# Patient Record
Sex: Female | Born: 1990 | Race: White | Hispanic: No | Marital: Single | Smoking: Former smoker
Health system: Southern US, Community
[De-identification: ages and names within clinical notes are randomized; demographics above are authoritative.]

---

## 2013-08-22 NOTE — L&D Delivery Note (Signed)
Delivery Note At 4:16 PM a viable female was delivered via Vaginal, Spontaneous Delivery (Presentation: Left Occiput Anterior).  APGAR: 9, 9; weight pending.   Placenta status: Intact, Spontaneous.  Cord:  with the following complications: None.  Anesthesia: Epidural  Episiotomy: None Lacerations: 1st degree Suture Repair: none Est. Blood Loss (mL): 400  Mom to postpartum.  Baby to Couplet care / Skin to Skin.  Kree Armato D 05/23/2014, 4:34 PM

## 2013-11-29 LAB — OB RESULTS CONSOLE GC/CHLAMYDIA
CHLAMYDIA, DNA PROBE: POSITIVE
GC PROBE AMP, GENITAL: NEGATIVE

## 2013-11-29 LAB — OB RESULTS CONSOLE ABO/RH: RH TYPE: POSITIVE

## 2013-11-29 LAB — OB RESULTS CONSOLE HEPATITIS B SURFACE ANTIGEN: HEP B S AG: NEGATIVE

## 2013-11-29 LAB — OB RESULTS CONSOLE RUBELLA ANTIBODY, IGM: Rubella: IMMUNE

## 2013-11-29 LAB — OB RESULTS CONSOLE HIV ANTIBODY (ROUTINE TESTING): HIV: NONREACTIVE

## 2013-11-29 LAB — OB RESULTS CONSOLE ANTIBODY SCREEN: Antibody Screen: NEGATIVE

## 2013-11-29 LAB — OB RESULTS CONSOLE RPR: RPR: NONREACTIVE

## 2014-05-01 LAB — OB RESULTS CONSOLE GC/CHLAMYDIA
Chlamydia: NEGATIVE
Gonorrhea: NEGATIVE

## 2014-05-01 LAB — OB RESULTS CONSOLE GBS: STREP GROUP B AG: NEGATIVE

## 2014-05-19 ENCOUNTER — Encounter (HOSPITAL_COMMUNITY): Payer: Self-pay | Admitting: *Deleted

## 2014-05-19 ENCOUNTER — Telehealth (HOSPITAL_COMMUNITY): Payer: Self-pay | Admitting: *Deleted

## 2014-05-19 NOTE — Telephone Encounter (Signed)
Preadmission screen  

## 2014-05-23 ENCOUNTER — Inpatient Hospital Stay (HOSPITAL_COMMUNITY): Payer: Medicaid Other | Admitting: Anesthesiology

## 2014-05-23 ENCOUNTER — Encounter (HOSPITAL_COMMUNITY): Payer: Self-pay

## 2014-05-23 ENCOUNTER — Encounter (HOSPITAL_COMMUNITY): Payer: Medicaid Other | Admitting: Anesthesiology

## 2014-05-23 ENCOUNTER — Inpatient Hospital Stay (HOSPITAL_COMMUNITY)
Admission: RE | Admit: 2014-05-23 | Discharge: 2014-05-25 | DRG: 775 | Disposition: A | Discharge status: Home / Self Care | Payer: Medicaid Other | Source: Ambulatory Visit | Attending: Obstetrics and Gynecology | Admitting: Obstetrics and Gynecology

## 2014-05-23 DIAGNOSIS — Z3490 Encounter for supervision of normal pregnancy, unspecified, unspecified trimester: Secondary | ICD-10-CM

## 2014-05-23 DIAGNOSIS — Z3A39 39 weeks gestation of pregnancy: Secondary | ICD-10-CM | POA: Diagnosis present

## 2014-05-23 DIAGNOSIS — O99334 Smoking (tobacco) complicating childbirth: Secondary | ICD-10-CM | POA: Diagnosis present

## 2014-05-23 DIAGNOSIS — Z3483 Encounter for supervision of other normal pregnancy, third trimester: Secondary | ICD-10-CM | POA: Diagnosis present

## 2014-05-23 LAB — CBC
HEMATOCRIT: 33.1 % — AB (ref 36.0–46.0)
HEMOGLOBIN: 12 g/dL (ref 12.0–15.0)
MCH: 31.5 pg (ref 26.0–34.0)
MCHC: 36.3 g/dL — ABNORMAL HIGH (ref 30.0–36.0)
MCV: 86.9 fL (ref 78.0–100.0)
Platelets: 221 10*3/uL (ref 150–400)
RBC: 3.81 MIL/uL — ABNORMAL LOW (ref 3.87–5.11)
RDW: 12.6 % (ref 11.5–15.5)
WBC: 7.8 10*3/uL (ref 4.0–10.5)

## 2014-05-23 LAB — TYPE AND SCREEN
ABO/RH(D): O POS
ANTIBODY SCREEN: NEGATIVE

## 2014-05-23 LAB — ABO/RH: ABO/RH(D): O POS

## 2014-05-23 LAB — RPR

## 2014-05-23 MED ORDER — BENZOCAINE-MENTHOL 20-0.5 % EX AERO
1.0000 "application " | INHALATION_SPRAY | CUTANEOUS | Status: DC | PRN
  Filled 2014-05-23: qty 56

## 2014-05-23 MED ORDER — PHENYLEPHRINE 40 MCG/ML (10ML) SYRINGE FOR IV PUSH (FOR BLOOD PRESSURE SUPPORT)
PREFILLED_SYRINGE | INTRAVENOUS | Status: AC
  Filled 2014-05-23: qty 10

## 2014-05-23 MED ORDER — OXYCODONE-ACETAMINOPHEN 5-325 MG PO TABS: 1.0000 | ORAL_TABLET | ORAL | Status: DC | PRN

## 2014-05-23 MED ORDER — ONDANSETRON HCL 4 MG/2ML IJ SOLN: 4.0000 mg | INTRAMUSCULAR | Status: DC | PRN

## 2014-05-23 MED ORDER — METHYLERGONOVINE MALEATE 0.2 MG PO TABS: 0.2000 mg | ORAL_TABLET | ORAL | Status: DC | PRN

## 2014-05-23 MED ORDER — OXYCODONE-ACETAMINOPHEN 5-325 MG PO TABS
1.0000 | ORAL_TABLET | ORAL | Status: DC | PRN
  Administered 2014-05-24 – 2014-05-25 (×2): 1 via ORAL
  Filled 2014-05-23 (×2): qty 1

## 2014-05-23 MED ORDER — LANOLIN HYDROUS EX OINT: TOPICAL_OINTMENT | CUTANEOUS | Status: DC | PRN

## 2014-05-23 MED ORDER — PHENYLEPHRINE 40 MCG/ML (10ML) SYRINGE FOR IV PUSH (FOR BLOOD PRESSURE SUPPORT)
80.0000 ug | PREFILLED_SYRINGE | INTRAVENOUS | Status: DC | PRN
  Filled 2014-05-23: qty 2

## 2014-05-23 MED ORDER — MAGNESIUM HYDROXIDE 400 MG/5ML PO SUSP: 30.0000 mL | ORAL | Status: DC | PRN

## 2014-05-23 MED ORDER — LACTATED RINGERS IV SOLN: 500.0000 mL | INTRAVENOUS | Status: DC | PRN

## 2014-05-23 MED ORDER — SIMETHICONE 80 MG PO CHEW
80.0000 mg | CHEWABLE_TABLET | ORAL | Status: DC | PRN
Start: 2014-05-23 — End: 2014-05-25

## 2014-05-23 MED ORDER — OXYCODONE-ACETAMINOPHEN 5-325 MG PO TABS: 2.0000 | ORAL_TABLET | ORAL | Status: DC | PRN

## 2014-05-23 MED ORDER — OXYTOCIN 40 UNITS IN LACTATED RINGERS INFUSION - SIMPLE MED
1.0000 m[IU]/min | INTRAVENOUS | Status: DC
  Administered 2014-05-23: 2 m[IU]/min via INTRAVENOUS
  Filled 2014-05-23 (×2): qty 1000

## 2014-05-23 MED ORDER — SENNOSIDES-DOCUSATE SODIUM 8.6-50 MG PO TABS
2.0000 | ORAL_TABLET | ORAL | Status: DC
  Administered 2014-05-23: 2 via ORAL
  Filled 2014-05-23 (×2): qty 2

## 2014-05-23 MED ORDER — ZOLPIDEM TARTRATE 5 MG PO TABS: 5.0000 mg | ORAL_TABLET | Freq: Every evening | ORAL | Status: DC | PRN

## 2014-05-23 MED ORDER — TETANUS-DIPHTH-ACELL PERTUSSIS 5-2.5-18.5 LF-MCG/0.5 IM SUSP
0.5000 mL | Freq: Once | INTRAMUSCULAR | Status: DC
Start: 2014-05-24 — End: 2014-05-25

## 2014-05-23 MED ORDER — DIBUCAINE 1 % RE OINT
1.0000 "application " | TOPICAL_OINTMENT | RECTAL | Status: DC | PRN
  Administered 2014-05-25: 1 via RECTAL
  Filled 2014-05-23: qty 28

## 2014-05-23 MED ORDER — FLEET ENEMA 7-19 GM/118ML RE ENEM: 1.0000 | ENEMA | RECTAL | Status: DC | PRN

## 2014-05-23 MED ORDER — METHYLERGONOVINE MALEATE 0.2 MG/ML IJ SOLN: 0.2000 mg | INTRAMUSCULAR | Status: DC | PRN

## 2014-05-23 MED ORDER — FENTANYL 2.5 MCG/ML BUPIVACAINE 1/10 % EPIDURAL INFUSION (WH - ANES)
INTRAMUSCULAR | Status: AC
  Filled 2014-05-23: qty 125

## 2014-05-23 MED ORDER — DIPHENHYDRAMINE HCL 25 MG PO CAPS: 25.0000 mg | ORAL_CAPSULE | Freq: Four times a day (QID) | ORAL | Status: DC | PRN

## 2014-05-23 MED ORDER — CITRIC ACID-SODIUM CITRATE 334-500 MG/5ML PO SOLN: 30.0000 mL | ORAL | Status: DC | PRN

## 2014-05-23 MED ORDER — MEASLES, MUMPS & RUBELLA VAC ~~LOC~~ INJ
0.5000 mL | INJECTION | Freq: Once | SUBCUTANEOUS | Status: DC
  Filled 2014-05-23: qty 0.5

## 2014-05-23 MED ORDER — OXYCODONE-ACETAMINOPHEN 5-325 MG PO TABS
2.0000 | ORAL_TABLET | ORAL | Status: DC | PRN
  Administered 2014-05-25: 2 via ORAL
  Filled 2014-05-23: qty 2

## 2014-05-23 MED ORDER — ONDANSETRON HCL 4 MG PO TABS: 4.0000 mg | ORAL_TABLET | ORAL | Status: DC | PRN

## 2014-05-23 MED ORDER — LIDOCAINE HCL (PF) 1 % IJ SOLN
INTRAMUSCULAR | Status: DC | PRN
  Administered 2014-05-23 (×5): 4 mL

## 2014-05-23 MED ORDER — EPHEDRINE 5 MG/ML INJ
10.0000 mg | INTRAVENOUS | Status: DC | PRN
  Filled 2014-05-23: qty 2

## 2014-05-23 MED ORDER — DIPHENHYDRAMINE HCL 50 MG/ML IJ SOLN: 12.5000 mg | INTRAMUSCULAR | Status: DC | PRN

## 2014-05-23 MED ORDER — PRENATAL MULTIVITAMIN CH
1.0000 | ORAL_TABLET | Freq: Every day | ORAL | Status: DC
  Administered 2014-05-24: 1 via ORAL
  Filled 2014-05-23: qty 1

## 2014-05-23 MED ORDER — OXYTOCIN 40 UNITS IN LACTATED RINGERS INFUSION - SIMPLE MED
62.5000 mL/h | INTRAVENOUS | Status: DC
  Administered 2014-05-23: 500 mL/h via INTRAVENOUS

## 2014-05-23 MED ORDER — ONDANSETRON HCL 4 MG/2ML IJ SOLN: 4.0000 mg | Freq: Four times a day (QID) | INTRAMUSCULAR | Status: DC | PRN

## 2014-05-23 MED ORDER — ACETAMINOPHEN 325 MG PO TABS: 650.0000 mg | ORAL_TABLET | ORAL | Status: DC | PRN

## 2014-05-23 MED ORDER — TERBUTALINE SULFATE 1 MG/ML IJ SOLN: 0.2500 mg | Freq: Once | INTRAMUSCULAR | Status: DC | PRN

## 2014-05-23 MED ORDER — LIDOCAINE HCL (PF) 1 % IJ SOLN
30.0000 mL | INTRAMUSCULAR | Status: DC | PRN
  Filled 2014-05-23: qty 30

## 2014-05-23 MED ORDER — IBUPROFEN 600 MG PO TABS
600.0000 mg | ORAL_TABLET | Freq: Four times a day (QID) | ORAL | Status: DC
  Administered 2014-05-23 – 2014-05-25 (×6): 600 mg via ORAL
  Filled 2014-05-23 (×6): qty 1

## 2014-05-23 MED ORDER — WITCH HAZEL-GLYCERIN EX PADS
1.0000 | MEDICATED_PAD | CUTANEOUS | Status: DC | PRN
Start: 2014-05-23 — End: 2014-05-25
  Administered 2014-05-25: 1 via TOPICAL

## 2014-05-23 MED ORDER — LACTATED RINGERS IV SOLN
500.0000 mL | Freq: Once | INTRAVENOUS | Status: AC
  Administered 2014-05-23: 1000 mL via INTRAVENOUS

## 2014-05-23 MED ORDER — LACTATED RINGERS IV SOLN
INTRAVENOUS | Status: DC
  Administered 2014-05-23: 1000 mL via INTRAVENOUS
  Administered 2014-05-23: 10:00:00 via INTRAVENOUS
  Administered 2014-05-23: 1000 mL via INTRAVENOUS

## 2014-05-23 MED ORDER — FENTANYL 2.5 MCG/ML BUPIVACAINE 1/10 % EPIDURAL INFUSION (WH - ANES)
14.0000 mL/h | INTRAMUSCULAR | Status: DC | PRN
  Administered 2014-05-23: 14 mL/h via EPIDURAL
  Filled 2014-05-23 (×2): qty 125

## 2014-05-23 MED ORDER — OXYTOCIN BOLUS FROM INFUSION: 500.0000 mL | INTRAVENOUS | Status: DC

## 2014-05-23 NOTE — Anesthesia Procedure Notes (Signed)
Epidural Patient location during procedure: OB Start time: 05/23/2014 10:59 AM  Staffing Anesthesiologist: Yeraldy Spike Performed by: anesthesiologist   Preanesthetic Checklist Completed: patient identified, site marked, surgical consent, pre-op evaluation, timeout performed, IV checked, risks and benefits discussed and monitors and equipment checked  Epidural Patient position: sitting Prep: site prepped and draped and DuraPrep Patient monitoring: continuous pulse ox and blood pressure Approach: midline Location: L3-L4 Injection technique: LOR air  Needle:  Needle type: Tuohy  Needle gauge: 17 G Needle length: 9 cm and 9 Needle insertion depth: 6 cm Catheter type: closed end flexible Catheter size: 19 Gauge Catheter at skin depth: 11 cm Test dose: negative  Assessment Events: blood not aspirated, injection not painful, no injection resistance, negative IV test and no paresthesia  Additional Notes Discussed risk of headache, infection, bleeding, nerve injury and failed or incomplete block.  Patient voices understanding and wishes to proceed.  Epidural placed easily on first attempt.  No paresthesia.  Patient nervous and moving during procedure, but with no apparent complications.  A.Kamoni Gentles, MDReason for block:procedure for pain

## 2014-05-23 NOTE — Progress Notes (Signed)
Comfortable with epidural Afeb, VSS FHT- Cat I VE-9/C/0, vtx Pitocin curretnty off, monitor progress, anticipate SVD

## 2014-05-23 NOTE — H&P (Signed)
Crystal Cannon is a 23 y.o. female, G2 P0010, EGA [redacted] weeks with EDC 10-9 presenting for elective induction with favorable cervix.  Prenatal care essentially uncomplicated, see prenatal records for complete history.  Maternal Medical History:  Fetal activity: Perceived fetal activity is normal.      OB History   Grav Para Term Preterm Abortions TAB SAB Ect Mult Living   2 0   1  1        No past medical history on file. No past surgical history on file. Family History: family history is not on file. Social History:  reports that she has been smoking.  She has never used smokeless tobacco. She reports that she does not drink alcohol or use illicit drugs.   Prenatal Transfer Tool  Maternal Diabetes: No Genetic Screening: Normal Maternal Ultrasounds/Referrals: Normal Fetal Ultrasounds or other Referrals:  None Maternal Substance Abuse:  No Significant Maternal Medications:  None Significant Maternal Lab Results:  Lab values include: Group B Strep negative Other Comments:  None  Review of Systems  Respiratory: Negative.   Cardiovascular: Negative.    AROM clear Dilation: 5 Effacement (%): 80 Station: -1 Exam by:: dr Crystal Cannon Blood pressure 132/84, pulse 90, temperature 97.7 F (36.5 C), temperature source Oral, resp. rate 20, last menstrual period 08/23/2013. Maternal Exam:  Uterine Assessment: Contraction strength is mild.  Contraction frequency is irregular.   Abdomen: Patient reports no abdominal tenderness. Estimated fetal weight is 7 1/2 lbs.   Fetal presentation: vertex  Introitus: Normal vulva. Normal vagina.  Amniotic fluid character: clear.  Pelvis: adequate for delivery.   Cervix: Cervix evaluated by digital exam.     Fetal Exam Fetal Monitor Review: Mode: ultrasound.   Baseline rate: 140.  Variability: moderate (6-25 bpm).   Pattern: accelerations present and no decelerations.    Fetal State Assessment: Category I - tracings are  normal.     Physical Exam  Constitutional: She appears well-developed and well-nourished.  Cardiovascular: Normal rate, regular rhythm and normal heart sounds.   No murmur heard. Respiratory: Effort normal and breath sounds normal. No respiratory distress. She has no wheezes.  GI: Soft.    Prenatal labs: ABO, Rh: O/Positive/-- (04/10 0000) Antibody: Negative (04/10 0000) Rubella: Immune (04/10 0000) RPR: Nonreactive (04/10 0000)  HBsAg: Negative (04/10 0000)  HIV: Non-reactive (04/10 0000)  GBS: Negative (09/10 0000)  GCT:  138, nl GTT  Assessment/Plan: IUP at 39 weeks with favorable cervix for induction.  AROM done, will start pitocin, monitor progress, anticipate SVD.   Crystal Cannon 05/23/2014, 8:29 AM

## 2014-05-23 NOTE — Anesthesia Preprocedure Evaluation (Signed)
Anesthesia Evaluation  Patient identified by MRN, date of birth, ID band Patient awake    Reviewed: Allergy & Precautions, H&P , NPO status , Patient's Chart, lab work & pertinent test results, reviewed documented beta blocker date and time   History of Anesthesia Complications Negative for: history of anesthetic complications  Airway Mallampati: III TM Distance: >3 FB Neck ROM: full    Dental  (+) Teeth Intact   Pulmonary Current Smoker,  breath sounds clear to auscultation        Cardiovascular negative cardio ROS  Rhythm:regular Rate:Normal     Neuro/Psych negative neurological ROS  negative psych ROS   GI/Hepatic negative GI ROS, Neg liver ROS,   Endo/Other  negative endocrine ROSBMI 35  Renal/GU negative Renal ROS     Musculoskeletal   Abdominal   Peds  Hematology negative hematology ROS (+)   Anesthesia Other Findings   Reproductive/Obstetrics (+) Pregnancy                           Anesthesia Physical Anesthesia Plan  ASA: II  Anesthesia Plan: Epidural   Post-op Pain Management:    Induction:   Airway Management Planned:   Additional Equipment:   Intra-op Plan:   Post-operative Plan:   Informed Consent: I have reviewed the patients History and Physical, chart, labs and discussed the procedure including the risks, benefits and alternatives for the proposed anesthesia with the patient or authorized representative who has indicated his/her understanding and acceptance.     Plan Discussed with:   Anesthesia Plan Comments:         Anesthesia Quick Evaluation

## 2014-05-24 ENCOUNTER — Encounter (HOSPITAL_COMMUNITY): Payer: Self-pay

## 2014-05-24 MED ORDER — INFLUENZA VAC SPLIT QUAD 0.5 ML IM SUSY
0.5000 mL | PREFILLED_SYRINGE | INTRAMUSCULAR | Status: AC
  Administered 2014-05-24: 0.5 mL via INTRAMUSCULAR

## 2014-05-24 NOTE — Anesthesia Postprocedure Evaluation (Signed)
  Anesthesia Post-op Note  Patient: Crystal Cannon  Procedure(s) Performed: * No procedures listed *  Patient Location: Mother/Baby  Anesthesia Type:Epidural  Level of Consciousness: awake  Airway and Oxygen Therapy: Patient Spontanous Breathing  Post-op Pain: mild  Post-op Assessment: Patient's Cardiovascular Status Stable and Respiratory Function Stable  Post-op Vital Signs: stable  Last Vitals:  Filed Vitals:   05/23/14 2350  BP: 119/62  Pulse: 86  Temp: 37.1 C  Resp: 18    Complications: No apparent anesthesia complications

## 2014-05-24 NOTE — Progress Notes (Signed)
PPD #1 No problems Afeb, VSS Fundus firm, NT at U-1 Continue routine postpartum care 

## 2014-05-25 MED ORDER — OXYCODONE-ACETAMINOPHEN 5-325 MG PO TABS: 1.0000 | ORAL_TABLET | ORAL | Status: DC | PRN

## 2014-05-25 MED ORDER — IBUPROFEN 600 MG PO TABS: 600.0000 mg | ORAL_TABLET | Freq: Four times a day (QID) | ORAL | Status: DC

## 2014-05-25 NOTE — Progress Notes (Signed)
PPD #2 No problems Afeb, VSS Fundus firm D/c home 

## 2014-05-25 NOTE — Discharge Summary (Signed)
Obstetric Discharge Summary Reason for Admission: induction of labor Prenatal Procedures: none Intrapartum Procedures: spontaneous vaginal delivery Postpartum Procedures: none Complications-Operative and Postpartum: 1st degree perineal laceration Hemoglobin  Date Value Ref Range Status  05/23/2014 12.0  12.0 - 15.0 g/dL Final     HCT  Date Value Ref Range Status  05/23/2014 33.1* 36.0 - 46.0 % Final    Physical Exam:  General: alert Lochia: appropriate Uterine Fundus: firm   Discharge Diagnoses: Term Pregnancy-delivered  Discharge Information: Date: 05/25/2014 Activity: pelvic rest Diet: routine Medications: Ibuprofen and Percocet Condition: stable Instructions: refer to practice specific booklet Discharge to: home Follow-up Information   Follow up with Murvin Gift D, MD. Schedule an appointment as soon as possible for a visit in 6 weeks.   Specialty:  Obstetrics and Gynecology   Contact information:   [address], [address] [place] [id number] [phone]       Newborn Data: Live born female  Birth Weight: 9 lb 1 oz (4111 g) APGAR: 9, 9  Home with mother.  Zavian Slowey D 05/25/2014, 9:55 AM

## 2014-05-25 NOTE — Discharge Instructions (Signed)
As per discharge pamphlet °

## 2014-05-26 NOTE — Progress Notes (Signed)
Ur chart review completed.  

## 2014-05-30 ENCOUNTER — Inpatient Hospital Stay (HOSPITAL_COMMUNITY): Admission: AD | Admit: 2014-05-30 | Payer: Self-pay | Source: Ambulatory Visit | Admitting: Obstetrics and Gynecology

## 2014-06-23 ENCOUNTER — Encounter (HOSPITAL_COMMUNITY): Payer: Self-pay

## 2020-09-10 DIAGNOSIS — G8929 Other chronic pain: Secondary | ICD-10-CM | POA: Insufficient documentation

## 2020-09-10 DIAGNOSIS — R519 Headache, unspecified: Secondary | ICD-10-CM | POA: Insufficient documentation

## 2021-01-13 ENCOUNTER — Ambulatory Visit (INDEPENDENT_AMBULATORY_CARE_PROVIDER_SITE_OTHER): Payer: Medicaid Other

## 2021-01-13 DIAGNOSIS — Z3A10 10 weeks gestation of pregnancy: Secondary | ICD-10-CM

## 2021-01-13 DIAGNOSIS — Z3480 Encounter for supervision of other normal pregnancy, unspecified trimester: Secondary | ICD-10-CM | POA: Insufficient documentation

## 2021-01-13 DIAGNOSIS — Z3481 Encounter for supervision of other normal pregnancy, first trimester: Secondary | ICD-10-CM

## 2021-01-13 MED ORDER — BLOOD PRESSURE KIT DEVI: 1.0000 | 0 refills | Status: DC | PRN

## 2021-01-13 NOTE — Progress Notes (Signed)
Agree with A & P. 

## 2021-01-13 NOTE — Progress Notes (Signed)
..  Virtual Visit via Telephone Note  I connected with Crystal Cannon on 01/13/21 at  9:00 AM EDT by telephone and verified that I am speaking with the correct person using two identifiers.  Location:[facility] Patient: Crystal Cannon Provider: Nurse   I discussed the limitations, risks, security and privacy concerns of performing an evaluation and management service by telephone and the availability of in person appointments. I also discussed with the patient that there may be a patient responsible charge related to this service. The patient expressed understanding and agreed to proceed.   History of Present Illness: PRENATAL INTAKE SUMMARY  Crystal Cannon presents today New OB Nurse Interview.  OB History    Gravida  4   Para  1   Term  1   Preterm      AB  2   Living  1     SAB  1   IAB  1   Ectopic      Multiple      Live Births  1          I have reviewed the patient's medical, obstetrical, social, and family histories, medications, and available lab results.  SUBJECTIVE She has no unusual complaints   Observations/Objective: Initial nurse interview for history/labs (New OB)  EDD: 08-05-21 GA: [redacted]w[redacted]d GP: G4P1  GENERAL APPEARANCE: Tele-visit, pt sounds alert and oriented  Assessment and Plan: Normal pregnancy Prenatal care: [facility] 01-20-21 Labs to be completed at next visit with provider on 01-20-2021. PHQ-9= 7 Blood pressure kit sent to [facility] sent to email   Follow Up Instructions:   I discussed the assessment and treatment plan with the patient. The patient was provided an opportunity to ask questions and all were answered. The patient agreed with the plan and demonstrated an understanding of the instructions.   The patient was advised to call back or seek an in-person evaluation if the symptoms worsen or if the condition fails to improve as anticipated.  I provided 15 minutes of non-face-to-face time during this  encounter.   Hinton Lovely, RN

## 2021-01-19 ENCOUNTER — Inpatient Hospital Stay (HOSPITAL_COMMUNITY)
Admission: AD | Admit: 2021-01-19 | Discharge: 2021-01-19 | Disposition: A | Discharge status: Home / Self Care | Payer: Medicaid Other | Attending: Obstetrics and Gynecology | Admitting: Obstetrics and Gynecology

## 2021-01-19 ENCOUNTER — Inpatient Hospital Stay (HOSPITAL_COMMUNITY): Payer: Medicaid Other

## 2021-01-19 ENCOUNTER — Encounter (HOSPITAL_COMMUNITY): Payer: Self-pay | Admitting: Obstetrics and Gynecology

## 2021-01-19 ENCOUNTER — Other Ambulatory Visit: Payer: Self-pay

## 2021-01-19 DIAGNOSIS — O209 Hemorrhage in early pregnancy, unspecified: Secondary | ICD-10-CM

## 2021-01-19 DIAGNOSIS — Z87891 Personal history of nicotine dependence: Secondary | ICD-10-CM | POA: Insufficient documentation

## 2021-01-19 DIAGNOSIS — O26891 Other specified pregnancy related conditions, first trimester: Secondary | ICD-10-CM | POA: Insufficient documentation

## 2021-01-19 DIAGNOSIS — O30041 Twin pregnancy, dichorionic/diamniotic, first trimester: Secondary | ICD-10-CM | POA: Insufficient documentation

## 2021-01-19 DIAGNOSIS — O4691 Antepartum hemorrhage, unspecified, first trimester: Secondary | ICD-10-CM

## 2021-01-19 DIAGNOSIS — Z3A11 11 weeks gestation of pregnancy: Secondary | ICD-10-CM | POA: Diagnosis not present

## 2021-01-19 DIAGNOSIS — R109 Unspecified abdominal pain: Secondary | ICD-10-CM | POA: Diagnosis not present

## 2021-01-19 LAB — CBC
HCT: 36.5 % (ref 36.0–46.0)
Hemoglobin: 13.2 g/dL (ref 12.0–15.0)
MCH: 30.8 pg (ref 26.0–34.0)
MCHC: 36.2 g/dL — ABNORMAL HIGH (ref 30.0–36.0)
MCV: 85.3 fL (ref 80.0–100.0)
Platelets: 281 10*3/uL (ref 150–400)
RBC: 4.28 MIL/uL (ref 3.87–5.11)
RDW: 12.8 % (ref 11.5–15.5)
WBC: 8.4 10*3/uL (ref 4.0–10.5)
nRBC: 0 % (ref 0.0–0.2)

## 2021-01-19 LAB — WET PREP, GENITAL
Clue Cells Wet Prep HPF POC: NONE SEEN
Sperm: NONE SEEN
Trich, Wet Prep: NONE SEEN
Yeast Wet Prep HPF POC: NONE SEEN

## 2021-01-19 LAB — URINALYSIS, ROUTINE W REFLEX MICROSCOPIC
Bilirubin Urine: NEGATIVE
Glucose, UA: NEGATIVE mg/dL
Ketones, ur: 80 mg/dL — AB
Leukocytes,Ua: NEGATIVE
Nitrite: NEGATIVE
Protein, ur: 30 mg/dL — AB
Specific Gravity, Urine: 1.025 (ref 1.005–1.030)
pH: 5 (ref 5.0–8.0)

## 2021-01-19 LAB — HCG, QUANTITATIVE, PREGNANCY: hCG, Beta Chain, Quant, S: 138889 m[IU]/mL — ABNORMAL HIGH (ref <5)

## 2021-01-19 LAB — HIV ANTIBODY (ROUTINE TESTING W REFLEX): HIV Screen 4th Generation wRfx: NONREACTIVE

## 2021-01-19 NOTE — MAU Note (Signed)
Presents with c/o lower abdominal cramping and VB that began @ 1230 this afternoon.  Reports VB has decreased, but was heavy and was changing sanitary napkins every 30 minutes to an hour.

## 2021-01-19 NOTE — Discharge Instructions (Signed)
Return to MAU:  If you have heavier bleeding that soaks through more that 2 pads per hour for an hour or more  If you bleed so much that you feel like you might pass out or you do pass out  If you have significant abdominal pain that is not improved with Tylenol 1000 mg every 6 hours as needed for pain  If you develop a fever > 100.5   

## 2021-01-19 NOTE — MAU Provider Note (Signed)
History     CSN: [id number]  Arrival date and time: 01/19/21 1407   Event Date/Time   First Provider Initiated Contact with Patient 01/19/21 1540      Chief Complaint  Patient presents with  . Vaginal Bleeding  . Abdominal Pain   Ms. Crystal Cannon is a 30 y.o. year old G59P1021 female at 59w5dweeks gestation who presents to MAU reporting lower abdominal cramping and VB since 1230 today. She reports the VB has decreased, "but is still heavy." She was changing a peripad every 30-60 mins. She reports last SI was last night. She [place] [facility] [place] where [place] and her spouse were informed they were carrying twins -- unsure of the type. [facility]; her NOB visit is scheduled for 01/20/2021. Her spouse is present and contributing to the history taking.     OB History    Gravida  4   Para  1   Term  1   Preterm      AB  2   Living  1     SAB  1   IAB  1   Ectopic      Multiple      Live Births  1           History reviewed. No pertinent past medical history.  History reviewed. No pertinent surgical history.  History reviewed. No pertinent family history.  Social History   Tobacco Use  . Smoking status: Former Smoker    Packs/day: 0.25    Quit date: 2015    Years since quitting: 7.4  . Smokeless tobacco: Never Used  Vaping Use  . Vaping Use: Never used  Substance Use Topics  . Alcohol use: No  . Drug use: No    Allergies: No Known Allergies  Medications Prior to Admission  Medication Sig Dispense Refill Last Dose  . Prenatal Vit-Fe Fumarate-FA (PRENATAL MULTIVITAMIN) TABS tablet Take 1 tablet by mouth daily at 12 noon.   01/18/2021 at Unknown time  . Blood Pressure Monitoring (BLOOD PRESSURE KIT) DEVI 1 kit by Does not apply route as needed. Large Cuff 1 each 0   . ibuprofen (ADVIL,MOTRIN) 600 MG tablet Take 1 tablet (600 mg total) by mouth every 6 (six) hours. (Patient not taking: Reported on  01/13/2021) 30 tablet 0   . oxyCODONE-acetaminophen (PERCOCET/ROXICET) 5-325 MG per tablet Take 1-2 tablets by mouth every 4 (four) hours as needed. (Patient not taking: Reported on 01/13/2021) 30 tablet 0     Review of Systems  Constitutional: Negative.   HENT: Negative.   Eyes: Negative.   Respiratory: Negative.   Cardiovascular: Negative.   Gastrointestinal: Negative.   Endocrine: Negative.   Genitourinary: Positive for pelvic pain and vaginal bleeding.  Musculoskeletal: Negative.   Skin: Negative.   Allergic/Immunologic: Negative.   Neurological: Negative.   Hematological: Negative.   Psychiatric/Behavioral: Negative.    Physical Exam   Blood pressure 131/83, pulse 71, temperature 98 F (36.7 C), temperature source Oral, resp. rate 20, height 5' 9"  (1.753 m), weight 117.7 kg, last menstrual period 10/29/2020, SpO2 99 %, unknown if currently breastfeeding.  Physical Exam Vitals and nursing note reviewed. Exam conducted with a chaperone present.  Constitutional:      Appearance: Normal appearance. She is obese.  Cardiovascular:     Rate and Rhythm: Normal rate.  Pulmonary:     Effort: Pulmonary effort is normal.  Abdominal:  Palpations: Abdomen is soft.  Genitourinary:    General: Normal vulva.     Comments: Pelvic exam: External genitalia normal, SE: vaginal walls pink and well rugated, cervix is smooth, pink, no lesions, small amt of light, red blood in vaginal vault, removed with 2 large cotton-tipped swabs -- WP, GC/CT done, cervix visually closed, Uterus is non-tender, unable to appreciate size of uterus d/t maternal body habitus, no CMT or friability, no adnexal tenderness.  Musculoskeletal:        General: Normal range of motion.  Skin:    General: Skin is warm and dry.  Neurological:     Mental Status: She is alert and oriented to person, place, and time.  Psychiatric:        Mood and Affect: Mood normal.        Behavior: Behavior normal.        Thought  Content: Thought content normal.        Judgment: Judgment normal.    Unsuccessful attempt to doppler FHTs by triage RN  MAU Course  Procedures  MDM CCUA UPT CBC ABO/Rh HCG Wet Prep GC/CT -- pending HIV -- pending OB < 14 wks [place] with TV  Results for orders placed or performed during the hospital encounter of 01/19/21 (from the past 24 hour(s))  Urinalysis, Routine w reflex microscopic Urine, Clean Catch     Status: Abnormal   Collection Time: 01/19/21  3:23 PM  Result Value Ref Range   Color, Urine YELLOW YELLOW   APPearance TURBID (A) CLEAR   Specific Gravity, Urine 1.025 1.005 - 1.030   pH 5.0 5.0 - 8.0   Glucose, UA NEGATIVE NEGATIVE mg/dL   Hgb urine dipstick MODERATE (A) NEGATIVE   Bilirubin Urine NEGATIVE NEGATIVE   Ketones, ur 80 (A) NEGATIVE mg/dL   Protein, ur 30 (A) NEGATIVE mg/dL   Nitrite NEGATIVE NEGATIVE   Leukocytes,Ua NEGATIVE NEGATIVE   RBC / HPF 21-50 0 - 5 RBC/hpf   WBC, UA 0-5 0 - 5 WBC/hpf   Bacteria, UA RARE (A) NONE SEEN   Squamous Epithelial / LPF 0-5 0 - 5   Mucus PRESENT    Amorphous Crystal PRESENT   Wet prep, genital     Status: Abnormal   Collection Time: 01/19/21  4:06 PM   Specimen: PATH Cytology Cervicovaginal Ancillary Only  Result Value Ref Range   Yeast Wet Prep HPF POC NONE SEEN NONE SEEN   Trich, Wet Prep NONE SEEN NONE SEEN   Clue Cells Wet Prep HPF POC NONE SEEN NONE SEEN   WBC, Wet Prep HPF POC FEW (A) NONE SEEN   Sperm NONE SEEN   HIV Antibody (routine testing w rflx)     Status: None   Collection Time: 01/19/21  4:09 PM  Result Value Ref Range   HIV Screen 4th Generation wRfx Non Reactive Non Reactive  CBC     Status: Abnormal   Collection Time: 01/19/21  4:09 PM  Result Value Ref Range   WBC 8.4 4.0 - 10.5 K/uL   RBC 4.28 3.87 - 5.11 MIL/uL   Hemoglobin 13.2 12.0 - 15.0 g/dL   HCT 36.5 36.0 - 46.0 %   MCV 85.3 80.0 - 100.0 fL   MCH 30.8 26.0 - 34.0 pg   MCHC 36.2 (H) 30.0 - 36.0 g/dL   RDW 12.8 11.5 - 15.5 %    Platelets 281 150 - 400 K/uL   nRBC 0.0 0.0 - 0.2 %  hCG, quantitative, pregnancy  Status: Abnormal   Collection Time: 01/19/21  4:09 PM  Result Value Ref Range   hCG, Beta Chain, Quant, S 138,889 (H) <5 mIU/mL    US OB Comp Less 14 Wks  Result Date: 01/19/2021 CLINICAL DATA:  Pregnancy. EXAM: TWIN OBSTETRICAL ULTRASOUND <14 WKS TECHNIQUE: Transabdominal ultrasound was performed for evaluation of the gestation as well as the maternal uterus and adnexal regions. COMPARISON:  None. FINDINGS: Number of IUPs:  2 Chorionicity/Amnionicity:  Dichorionic-diamniotic (thick membrane) TWIN 1 Yolk sac:  Not Visualized. Embryo:  Visualized. Cardiac Activity: Visualized. Heart Rate: 166 bpm CRL:   54.5 mm   12 w 0 d                  [place] EDC: 08/03/2021 TWIN 2 Yolk sac:  Not Visualized. Embryo:  Visualized. Cardiac Activity: Visualized. Heart Rate: 176 bpm CRL:   50.5 mm   11 w 5 d                  [place] EDC: 08/05/2021 Subchorionic hemorrhage:  None visualized. Maternal uterus/adnexae: Ovaries are not definitively seen. No free fluid within the pelvis. IMPRESSION: Live twin intrauterine pregnancy. Twin 1 measures 12 weeks 0 days by crown-rump length. Twin 2 measures 11 weeks 5 days by crown-rump length. Electronically Signed   By: Davina Poke D.O.   On: 01/19/2021 16:52   US OB Comp AddL Gest Less 14 Wks  Result Date: 01/19/2021 CLINICAL DATA:  Pregnancy. EXAM: TWIN OBSTETRICAL ULTRASOUND <14 WKS TECHNIQUE: Transabdominal ultrasound was performed for evaluation of the gestation as well as the maternal uterus and adnexal regions. COMPARISON:  None. FINDINGS: Number of IUPs:  2 Chorionicity/Amnionicity:  Dichorionic-diamniotic (thick membrane) TWIN 1 Yolk sac:  Not Visualized. Embryo:  Visualized. Cardiac Activity: Visualized. Heart Rate: 166 bpm CRL:   54.5 mm   12 w 0 d                  [place] EDC: 08/03/2021 TWIN 2 Yolk sac:  Not Visualized. Embryo:  Visualized. Cardiac Activity: Visualized. Heart Rate: 176 bpm  CRL:   50.5 mm   11 w 5 d                  [place] EDC: 08/05/2021 Subchorionic hemorrhage:  None visualized. Maternal uterus/adnexae: Ovaries are not definitively seen. No free fluid within the pelvis. IMPRESSION: Live twin intrauterine pregnancy. Twin 1 measures 12 weeks 0 days by crown-rump length. Twin 2 measures 11 weeks 5 days by crown-rump length. Electronically Signed   By: Davina Poke D.O.   On: 01/19/2021 16:52     Assessment and Plan  Vaginal bleeding in pregnancy, first trimester - Discussed the possibility that she could have had a [facility] that was not previously noted on 1st U/S - Explained the bleeding profile with [facility] - Reassurance given that [facility] was NOT seen on U/S today; which could mean that she did not have one or if she had one it is completely resolved today -- which could also explain the VB she has had - Information provided on VB in pregnancy, [facility] - Return to MAU:  If you have heavier bleeding that soaks through more that 2 pads per hour for an hour or more  If you bleed so much that you feel like you might pass out or you do pass out  If you have significant abdominal pain that is not improved with Tylenol 1000 mg every 6 hours as needed for pain  If you develop a fever > 100.5   Dichorionic diamniotic 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     Laury Deep, CNM 01/19/2021, 4:11 PM

## 2021-01-20 ENCOUNTER — Other Ambulatory Visit (HOSPITAL_COMMUNITY)
Admission: RE | Admit: 2021-01-20 | Discharge: 2021-01-20 | Disposition: A | Discharge status: Home / Self Care | Payer: Medicaid Other | Source: Ambulatory Visit | Attending: Family Medicine | Admitting: Family Medicine

## 2021-01-20 ENCOUNTER — Encounter: Payer: Self-pay | Admitting: Family Medicine

## 2021-01-20 ENCOUNTER — Ambulatory Visit (INDEPENDENT_AMBULATORY_CARE_PROVIDER_SITE_OTHER): Payer: Medicaid Other | Admitting: Family Medicine

## 2021-01-20 VITALS — BP 129/84 | HR 92 | Wt 261.0 lb

## 2021-01-20 DIAGNOSIS — Z3A11 11 weeks gestation of pregnancy: Secondary | ICD-10-CM | POA: Diagnosis not present

## 2021-01-20 DIAGNOSIS — Z3480 Encounter for supervision of other normal pregnancy, unspecified trimester: Secondary | ICD-10-CM | POA: Diagnosis present

## 2021-01-20 DIAGNOSIS — Z3481 Encounter for supervision of other normal pregnancy, first trimester: Secondary | ICD-10-CM

## 2021-01-20 DIAGNOSIS — O30041 Twin pregnancy, dichorionic/diamniotic, first trimester: Secondary | ICD-10-CM | POA: Diagnosis not present

## 2021-01-20 DIAGNOSIS — R8271 Bacteriuria: Secondary | ICD-10-CM

## 2021-01-20 DIAGNOSIS — O30049 Twin pregnancy, dichorionic/diamniotic, unspecified trimester: Secondary | ICD-10-CM | POA: Insufficient documentation

## 2021-01-20 DIAGNOSIS — Z2839 Other underimmunization status: Secondary | ICD-10-CM

## 2021-01-20 DIAGNOSIS — O09899 Supervision of other high risk pregnancies, unspecified trimester: Secondary | ICD-10-CM

## 2021-01-20 LAB — GC/CHLAMYDIA PROBE AMP (~~LOC~~) NOT AT ARMC
Chlamydia: NEGATIVE
Comment: NEGATIVE
Comment: NORMAL
Neisseria Gonorrhea: NEGATIVE

## 2021-01-20 MED ORDER — ASPIRIN EC 81 MG PO TBEC
81.0000 mg | DELAYED_RELEASE_TABLET | Freq: Every day | ORAL | 11 refills | Status: DC
Start: 2021-01-20 — End: 2021-06-23

## 2021-01-20 NOTE — Progress Notes (Signed)
Denies vaginal bleeding today. Reports still having some crampy low abd pain.

## 2021-01-20 NOTE — Patient Instructions (Signed)
[employer] ([place]).Twin and Triplet Pregnancy. [place]: [employer] ([place]); 2019.">  Multiple Pregnancy Multiple pregnancy means that a woman is carrying more than one baby at a time. She may be pregnant with twins, triplets, or more. The majority of multiple pregnancies are twins. Naturally conceiving triplets or more (higher-order multiples) is rare. Multiple pregnancies are riskier than single pregnancies. A woman with a multiple pregnancy is more likely to have certain problems during her pregnancy. How does a multiple pregnancy happen? A multiple pregnancy happens when:  The woman's body releases more than one egg at a time, and then each egg gets fertilized by a different sperm. ? This is the most common type of multiple pregnancy. ? Twins or other multiples produced this way are called fraternal. They are no more alike than non-multiple siblings are.  One sperm fertilizes one egg, which then divides into more than one embryo. ? Twins or other multiples produced this way are called identical. Identical multiples are always the same gender, and they look very much alike.   Who is most likely to have a multiple pregnancy? A multiple pregnancy is more likely to develop in women who:  Have had fertility treatment, especially if the treatment included fertility medicines.  Are older than 29 years of age.  Have already had four or more children.  Have a family history of multiple pregnancy. How is a multiple pregnancy diagnosed? A multiple pregnancy may be diagnosed based on:  Symptoms such as: ? Rapid weight gain in the first 3 months of pregnancy (first trimester). ? More severe nausea and breast tenderness than what is typical of a single pregnancy. ? A larger uterus than what is normal for the stage of the pregnancy.  Blood tests that detect a higher-than-normal level of human chorionic gonadotropin (hCG). This is a hormone that  your body produces in early pregnancy.  An ultrasound exam. This is used to confirm that you are carrying multiples. What risks come with multiple pregnancy? A multiple pregnancy puts you at a higher risk for certain problems during or after your pregnancy. These include:  Delivering your babies before your due date (preterm birth). A full-term pregnancy lasts for at least 37 weeks. ? Babies born before 37 weeks may have a higher risk for breathing problems, feeding difficulties, cerebral palsy, and learning disabilities.  Diabetes.  Preeclampsia. This is a serious condition that causes high blood pressure and headaches during pregnancy.  Too much blood loss after childbirth (postpartum hemorrhage).  Postpartum depression.  Low birth weight of the babies. How will having a multiple pregnancy affect my care? Your health care team will monitor you more closely. You may need more frequent prenatal visits. This will ensure that you are healthy and that your babies are growing normally. Follow these instructions at home: Eating and drinking  Increase your nutrition. ? Follow your health care provider's recommendations for weight gain. You may need to gain a little extra weight when you are pregnant with multiples. ? Eat healthy snacks often throughout the day. This will add calories and reduce nausea.  Drink enough fluid to keep your urine pale yellow.  Take prenatal vitamins. Ask your health care provider what vitamins are right for you. Activity Limit your activities by 20-24 weeks of pregnancy.  Rest often.  Avoid activities, exercise, and work that take a lot of effort.  Ask your health care provider when you should stop having sex. General instructions  Do  not use any products that contain nicotine or tobacco, such as cigarettes, e-cigarettes, and chewing tobacco. If you need help quitting, ask your health care provider.  Do not drink alcohol or use illegal drugs.  Take  over-the-counter and prescription medicines only as told by your health care provider.  Arrange for extra help around the house.  Keep all follow-up visits and all prenatal visits as told by your health care provider. This is important. Where to find more information  [employer]: www.acog.org Contact a health care provider if:  You have dizziness.  You have nausea, vomiting, or diarrhea that does not go away.  You have depression or other emotions that are interfering with your normal activities.  You have a fever.  You have pain with urination.  You have a bad-smelling vaginal discharge.  You notice increased swelling in your face, hands, legs, or ankles. Get help right away if:  You have fluid leaking from your vagina.  You have bleeding from your vagina.  You have pelvic cramps, pelvic pressure, or nagging pain in your abdomen or lower back.  You are having regular contractions.  You have a severe headache, with or without changes in how you see.  You have chest pain or shortness of breath.  You notice that your babies move less often, or do not move at all. Summary  Having a multiple pregnancy means that a woman is carrying more than one baby at a time.  A multiple pregnancy puts you at a higher risk for delivering your babies before your due date, having diabetes, preeclampsia, too much blood loss after childbirth, or low birth weight of the babies.  Your health care provider will monitor you more closely during your pregnancy.  You may need to make some lifestyle changes during pregnancy. This includes eating more, limiting your activities after 20-24 weeks of pregnancy, and arranging for extra help around the house.  Follow up with your health care provider as instructed if you experience any complications. This information is not intended to replace advice given to you by your health care provider. Make sure you discuss  any questions you have with your health care provider. Document Revised: 04/01/2019 Document Reviewed: 04/01/2019 Elsevier Patient Education  2021 [employer].   Contraception Choices Contraception, also called birth control, refers to methods or devices that prevent pregnancy. Hormonal methods Contraceptive implant A contraceptive implant is a thin, plastic tube that contains a hormone that prevents pregnancy. It is different from an intrauterine device (IUD). It is inserted into the upper part of the arm by a health care provider. Implants can be effective for up to 3 years. Progestin-only injections Progestin-only injections are injections of progestin, a synthetic form of the hormone progesterone. They are given every 3 months by a health care provider. Birth control pills Birth control pills are pills that contain hormones that prevent pregnancy. They must be taken once a day, preferably at the same time each day. A prescription is needed to use this method of contraception. Birth control patch The birth control patch contains hormones that prevent pregnancy. It is placed on the skin and must be changed once a week for three weeks and removed on the fourth week. A prescription is needed to use this method of contraception. Vaginal ring A vaginal ring contains hormones that prevent pregnancy. It is placed in the vagina for three weeks and removed on the fourth week. After that, the process is repeated with a  new ring. A prescription is needed to use this method of contraception. Emergency contraceptive Emergency contraceptives prevent pregnancy after unprotected sex. They come in pill form and can be taken up to 5 days after sex. They work best the sooner they are taken after having sex. Most emergency contraceptives are available without a prescription. This method should not be used as your only form of birth control.   Barrier methods Female condom A female condom is a thin sheath that is  worn over the penis during sex. Condoms keep sperm from going inside a woman's body. They can be used with a sperm-killing substance (spermicide) to increase their effectiveness. They should be thrown away after one use. Female condom A female condom is a soft, loose-fitting sheath that is put into the vagina before sex. The condom keeps sperm from going inside a woman's body. They should be thrown away after one use. Diaphragm A diaphragm is a soft, dome-shaped barrier. It is inserted into the vagina before sex, along with a spermicide. The diaphragm blocks sperm from entering the uterus, and the spermicide kills sperm. A diaphragm should be left in the vagina for 6-8 hours after sex and removed within 24 hours. A diaphragm is prescribed and fitted by a health care provider. A diaphragm should be replaced every 1-2 years, after giving birth, after gaining more than 15 lb (6.8 kg), and after pelvic surgery. Cervical cap A cervical cap is a round, soft latex or plastic cup that fits over the cervix. It is inserted into the vagina before sex, along with spermicide. It blocks sperm from entering the uterus. The cap should be left in place for 6-8 hours after sex and removed within 48 hours. A cervical cap must be prescribed and fitted by a health care provider. It should be replaced every 2 years. Sponge A sponge is a soft, circular piece of polyurethane foam with spermicide in it. The sponge helps block sperm from entering the uterus, and the spermicide kills sperm. To use it, you make it wet and then insert it into the vagina. It should be inserted before sex, left in for at least 6 hours after sex, and removed and thrown away within 30 hours. Spermicides Spermicides are chemicals that kill or block sperm from entering the cervix and uterus. They can come as a cream, jelly, suppository, foam, or tablet. A spermicide should be inserted into the vagina with an applicator at least 10-15 minutes before sex to  allow time for it to work. The process must be repeated every time you have sex. Spermicides do not require a prescription.   Intrauterine contraception Intrauterine device (IUD) An IUD is a T-shaped device that is put in a woman's uterus. There are two types:  Hormone IUD.This type contains progestin, a synthetic form of the hormone progesterone. This type can stay in place for 3-5 years.  Copper IUD.This type is wrapped in copper wire. It can stay in place for 10 years. Permanent methods of contraception Female tubal ligation In this method, a woman's fallopian tubes are sealed, tied, or blocked during surgery to prevent eggs from traveling to the uterus. Hysteroscopic sterilization In this method, a small, flexible insert is placed into each fallopian tube. The inserts cause scar tissue to form in the fallopian tubes and block them, so sperm cannot reach an egg. The procedure takes about 3 months to be effective. Another form of birth control must be used during those 3 months. Female sterilization This is a  procedure to tie off the tubes that carry sperm (vasectomy). After the procedure, the man can still ejaculate fluid (semen). Another form of birth control must be used for 3 months after the procedure. Natural planning methods Natural family planning In this method, a couple does not have sex on days when the woman could become pregnant. Calendar method In this method, the woman keeps track of the length of each menstrual cycle, identifies the days when pregnancy can happen, and does not have sex on those days. Ovulation method In this method, a couple avoids sex during ovulation. Symptothermal method This method involves not having sex during ovulation. The woman typically checks for ovulation by watching changes in her temperature and in the consistency of cervical mucus. Post-ovulation method In this method, a couple waits to have sex until after ovulation. Where to find more  information  Centers for Disease Control and Prevention: [employer] Summary  Contraception, also called birth control, refers to methods or devices that prevent pregnancy.  Hormonal methods of contraception include implants, injections, pills, patches, vaginal rings, and emergency contraceptives.  Barrier methods of contraception can include female condoms, female condoms, diaphragms, cervical caps, sponges, and spermicides.  There are two types of IUDs (intrauterine devices). An IUD can be put in a woman's uterus to prevent pregnancy for 3-5 years.  Permanent sterilization can be done through a procedure for males and females. Natural family planning methods involve nothaving sex on days when the woman could become pregnant. This information is not intended to replace advice given to you by your health care provider. Make sure you discuss any questions you have with your health care provider. Document Revised: 01/13/2020 Document Reviewed: 01/13/2020 Elsevier Patient Education  2021 Elsevier Inc.   Breastfeeding  Choosing to breastfeed is one of the best decisions you can make for yourself and your baby. A change in hormones during pregnancy causes your breasts to make breast milk in your milk-producing glands. Hormones prevent breast milk from being released before your baby is born. They also prompt milk flow after birth. Once breastfeeding has begun, thoughts of your baby, as well as his or her sucking or crying, can stimulate the release of milk from your milk-producing glands. Benefits of breastfeeding Research shows that breastfeeding offers many health benefits for infants and mothers. It also offers a cost-free and convenient way to feed your baby. For your baby  Your first milk (colostrum) helps your baby's digestive system to function better.  Special cells in your milk (antibodies) help your baby to fight off infections.  Breastfed babies are less likely to develop asthma,  allergies, obesity, or type 2 diabetes. They are also at lower risk for sudden infant death syndrome (SIDS).  Nutrients in breast milk are better able to meet your baby's needs compared to infant formula.  Breast milk improves your baby's brain development. For you  Breastfeeding helps to create a very special bond between you and your baby.  Breastfeeding is convenient. Breast milk costs nothing and is always available at the correct temperature.  Breastfeeding helps to burn calories. It helps you to lose the weight that you gained during pregnancy.  Breastfeeding makes your uterus return faster to its size before pregnancy. It also slows bleeding (lochia) after you give birth.  Breastfeeding helps to lower your risk of developing type 2 diabetes, osteoporosis, rheumatoid arthritis, cardiovascular disease, and breast, ovarian, uterine, and endometrial cancer later in life. Breastfeeding basics Starting breastfeeding  Find a comfortable place to sit  or lie down, with your neck and back well-supported.  Place a pillow or a rolled-up blanket under your baby to bring him or her to the level of your breast (if you are seated). Nursing pillows are specially designed to help support your arms and your baby while you breastfeed.  Make sure that your baby's tummy (abdomen) is facing your abdomen.  Gently massage your breast. With your fingertips, massage from the outer edges of your breast inward toward the nipple. This encourages milk flow. If your milk flows slowly, you may need to continue this action during the feeding.  Support your breast with 4 fingers underneath and your thumb above your nipple (make the letter "C" with your hand). Make sure your fingers are well away from your nipple and your baby's mouth.  Stroke your baby's lips gently with your finger or nipple.  When your baby's mouth is open wide enough, quickly bring your baby to your breast, placing your entire nipple and as  much of the areola as possible into your baby's mouth. The areola is the colored area around your nipple. ? More areola should be visible above your baby's upper lip than below the lower lip. ? Your baby's lips should be opened and extended outward (flanged) to ensure an adequate, comfortable latch. ? Your baby's tongue should be between his or her lower gum and your breast.  Make sure that your baby's mouth is correctly positioned around your nipple (latched). Your baby's lips should create a seal on your breast and be turned out (everted).  It is common for your baby to suck about 2-3 minutes in order to start the flow of breast milk. Latching Teaching your baby how to latch onto your breast properly is very important. An improper latch can cause nipple pain, decreased milk supply, and poor weight gain in your baby. Also, if your baby is not latched onto your nipple properly, he or she may swallow some air during feeding. This can make your baby fussy. Burping your baby when you switch breasts during the feeding can help to get rid of the air. However, teaching your baby to latch on properly is still the best way to prevent fussiness from swallowing air while breastfeeding. Signs that your baby has successfully latched onto your nipple  Silent tugging or silent sucking, without causing you pain. Infant's lips should be extended outward (flanged).  Swallowing heard between every 3-4 sucks once your milk has started to flow (after your let-down milk reflex occurs).  Muscle movement above and in front of his or her ears while sucking. Signs that your baby has not successfully latched onto your nipple  Sucking sounds or smacking sounds from your baby while breastfeeding.  Nipple pain. If you think your baby has not latched on correctly, slip your finger into the corner of your baby's mouth to break the suction and place it between your baby's gums. Attempt to start breastfeeding again. Signs of  successful breastfeeding Signs from your baby  Your baby will gradually decrease the number of sucks or will completely stop sucking.  Your baby will fall asleep.  Your baby's body will relax.  Your baby will retain a small amount of milk in his or her mouth.  Your baby will let go of your breast by himself or herself. Signs from you  Breasts that have increased in firmness, weight, and size 1-3 hours after feeding.  Breasts that are softer immediately after breastfeeding.  Increased milk volume, as  well as a change in milk consistency and color by the fifth day of breastfeeding.  Nipples that are not sore, cracked, or bleeding. Signs that your baby is getting enough milk  Wetting at least 1-2 diapers during the first 24 hours after birth.  Wetting at least 5-6 diapers every 24 hours for the first week after birth. The urine should be clear or pale yellow by the age of 5 days.  Wetting 6-8 diapers every 24 hours as your baby continues to grow and develop.  At least 3 stools in a 24-hour period by the age of 5 days. The stool should be soft and yellow.  At least 3 stools in a 24-hour period by the age of 7 days. The stool should be seedy and yellow.  No loss of weight greater than 10% of birth weight during the first 3 days of life.  Average weight gain of 4-7 oz (113-198 g) per week after the age of 4 days.  Consistent daily weight gain by the age of 5 days, without weight loss after the age of 2 weeks. After a feeding, your baby may spit up a small amount of milk. This is normal. Breastfeeding frequency and duration Frequent feeding will help you make more milk and can prevent sore nipples and extremely full breasts (breast engorgement). Breastfeed when you feel the need to reduce the fullness of your breasts or when your baby shows signs of hunger. This is called "breastfeeding on demand." Signs that your baby is hungry include:  Increased alertness, activity, or  restlessness.  Movement of the head from side to side.  Opening of the mouth when the corner of the mouth or cheek is stroked (rooting).  Increased sucking sounds, smacking lips, cooing, sighing, or squeaking.  Hand-to-mouth movements and sucking on fingers or hands.  Fussing or crying. Avoid introducing a pacifier to your baby in the first 4-6 weeks after your baby is born. After this time, you may choose to use a pacifier. Research has shown that pacifier use during the first year of a baby's life decreases the risk of sudden infant death syndrome (SIDS). Allow your baby to feed on each breast as long as he or she wants. When your baby unlatches or falls asleep while feeding from the first breast, offer the second breast. Because newborns are often sleepy in the first few weeks of life, you may need to awaken your baby to get him or her to feed. Breastfeeding times will vary from baby to baby. However, the following rules can serve as a guide to help you make sure that your baby is properly fed:  Newborns (babies 45 weeks of age or younger) may breastfeed every 1-3 hours.  Newborns should not go without breastfeeding for longer than 3 hours during the day or 5 hours during the night.  You should breastfeed your baby a minimum of 8 times in a 24-hour period. Breast milk pumping Pumping and storing breast milk allows you to make sure that your baby is exclusively fed your breast milk, even at times when you are unable to breastfeed. This is especially important if you go back to work while you are still breastfeeding, or if you are not able to be present during feedings. Your lactation consultant can help you find a method of pumping that works best for you and give you guidelines about how long it is safe to store breast milk.      Caring for your breasts while you  breastfeed Nipples can become dry, cracked, and sore while breastfeeding. The following recommendations can help keep your  breasts moisturized and healthy:  Avoid using soap on your nipples.  Wear a supportive bra designed especially for nursing. Avoid wearing underwire-style bras or extremely tight bras (sports bras).  Air-dry your nipples for 3-4 minutes after each feeding.  Use only cotton bra pads to absorb leaked breast milk. Leaking of breast milk between feedings is normal.  Use lanolin on your nipples after breastfeeding. Lanolin helps to maintain your skin's normal moisture barrier. Pure lanolin is not harmful (not toxic) to your baby. You may also hand express a few drops of breast milk and gently massage that milk into your nipples and allow the milk to air-dry. In the first few weeks after giving birth, some women experience breast engorgement. Engorgement can make your breasts feel heavy, warm, and tender to the touch. Engorgement peaks within 3-5 days after you give birth. The following recommendations can help to ease engorgement:  Completely empty your breasts while breastfeeding or pumping. You may want to start by applying warm, moist heat (in the shower or with warm, water-soaked hand towels) just before feeding or pumping. This increases circulation and helps the milk flow. If your baby does not completely empty your breasts while breastfeeding, pump any extra milk after he or she is finished.  Apply ice packs to your breasts immediately after breastfeeding or pumping, unless this is too uncomfortable for you. To do this: ? Put ice in a plastic bag. ? Place a towel between your skin and the bag. ? Leave the ice on for 20 minutes, 2-3 times a day.  Make sure that your baby is latched on and positioned properly while breastfeeding. If engorgement persists after 48 hours of following these recommendations, contact your health care provider or a Advertising copywriter. Overall health care recommendations while breastfeeding  Eat 3 healthy meals and 3 snacks every day. Well-nourished mothers who are  breastfeeding need an additional 450-500 calories a day. You can meet this requirement by increasing the amount of a balanced diet that you eat.  Drink enough water to keep your urine pale yellow or clear.  Rest often, relax, and continue to take your prenatal vitamins to prevent fatigue, stress, and low vitamin and mineral levels in your body (nutrient deficiencies).  Do not use any products that contain nicotine or tobacco, such as cigarettes and e-cigarettes. Your baby may be harmed by chemicals from cigarettes that pass into breast milk and exposure to secondhand smoke. If you need help quitting, ask your health care provider.  Avoid alcohol.  Do not use illegal drugs or marijuana.  Talk with your health care provider before taking any medicines. These include over-the-counter and prescription medicines as well as vitamins and herbal supplements. Some medicines that may be harmful to your baby can pass through breast milk.  It is possible to become pregnant while breastfeeding. If birth control is desired, ask your health care provider about options that will be safe while breastfeeding your baby. Where to find more information: [employer]: www.llli.org Contact a health care provider if:  You feel like you want to stop breastfeeding or have become frustrated with breastfeeding.  Your nipples are cracked or bleeding.  Your breasts are red, tender, or warm.  You have: ? Painful breasts or nipples. ? A swollen area on either breast. ? A fever or chills. ? Nausea or vomiting. ? Drainage other than breast milk  from your nipples.  Your breasts do not become full before feedings by the fifth day after you give birth.  You feel sad and depressed.  Your baby is: ? Too sleepy to eat well. ? Having trouble sleeping. ? More than 54 week old and wetting fewer than 6 diapers in a 24-hour period. ? Not gaining weight by 58 days of age.  Your baby has fewer than 3  stools in a 24-hour period.  Your baby's skin or the white parts of his or her eyes become yellow. Get help right away if:  Your baby is overly tired (lethargic) and does not want to wake up and feed.  Your baby develops an unexplained fever. Summary  Breastfeeding offers many health benefits for infant and mothers.  Try to breastfeed your infant when he or she shows early signs of hunger.  Gently tickle or stroke your baby's lips with your finger or nipple to allow the baby to open his or her mouth. Bring the baby to your breast. Make sure that much of the areola is in your baby's mouth. Offer one side and burp the baby before you offer the other side.  Talk with your health care provider or lactation consultant if you have questions or you face problems as you breastfeed. This information is not intended to replace advice given to you by your health care provider. Make sure you discuss any questions you have with your health care provider. Document Revised: 11/02/2017 Document Reviewed: 09/09/2016 Elsevier Patient Education  2021 [employer].

## 2021-01-20 NOTE — Progress Notes (Signed)
Subjective:   Crystal Cannon is a 30 y.o. [id number] at 67w6dby early ultrasound being seen today for her first obstetrical visit.  Her obstetrical history is significant for di/di twins. Patient does not intend to breast feed. Pregnancy history fully reviewed.  Patient reports no complaints.  Seen in MAU last night for vaginal bleeding and diagnosed with di/di twins.  Reports that bleeding has stopped today  HISTORY: OB History  Gravida Para Term Preterm AB Living  _0 0 2 1  SAB IAB Ectopic Multiple Live Births  1 1 0 0 1    # Outcome Date GA Lbr Len/2nd Weight Sex Delivery Anes PTL Lv  4 Current           3 IAB 2019 774w0d       2 Term 05/23/14 3977w0d:00 / 00:51 9 lb 1 oz (4.111 kg) F Vag-Spont EPI  LIV     Name: Alviar,GIRL Kimberlynn     Apgar1: 9  Apgar5: 9  1 SAB 2012             Last pap smear was  After birth of her last child, she is due   History reviewed. No pertinent past medical history. History reviewed. No pertinent surgical history. History reviewed. No pertinent family history. Social History   Tobacco Use  . Smoking status: Former Smoker    Packs/day: 0.25    Quit date: 2015    Years since quitting: 7.4  . Smokeless tobacco: Never Used  Vaping Use  . Vaping Use: Never used  Substance Use Topics  . Alcohol use: No  . Drug use: No   No Known Allergies Current Outpatient Medications on File Prior to Visit  Medication Sig Dispense Refill  . Prenatal Vit-Fe Fumarate-FA (PRENATAL MULTIVITAMIN) TABS tablet Take 1 tablet by mouth daily at 12 noon.    . Blood Pressure Monitoring (BLOOD PRESSURE KIT) DEVI 1 kit by Does not apply route as needed. Large Cuff 1 each 0   No current facility-administered medications on file prior to visit.     Exam   Vitals:   01/20/21 1310  BP: 129/84  Pulse: 92  Weight: 261 lb (118.4 kg)      Uterus:     Pelvic Exam: Perineum: no hemorrhoids, normal perineum   Vulva: normal external genitalia, no lesions    Vagina:  normal mucosa, normal discharge   Cervix: no lesions and normal, pap smear done.   System: General: well-developed, well-nourished female in no acute distress   Skin: normal coloration and turgor, no rashes   Neurologic: oriented, normal, negative, normal mood   Extremities: normal strength, tone, and muscle mass, ROM of all joints is normal   HEENT PERRLA, extraocular movement intact and sclera clear, anicteric   Mouth/Teeth mucous membranes moist, pharynx normal without lesions and dental hygiene good   Neck supple and no masses   Respiratory:  no respiratory distress     Assessment:   Pregnancy: [id number] Active Problem List   Diagnosis Date Noted  . Dichorionic diamniotic twin gestation 01/20/2021  . Supervision of other normal pregnancy, antepartum 01/13/2021  . Pregnancy 05/23/2014  . SVD (spontaneous vaginal delivery) 05/23/2014     Plan:  1. Supervision of other normal pregnancy, antepartum Pap collected Initial labs drawn. Continue prenatal vitamins. Genetic Screening discussed, NIPS: ordered. Ultrasound discussed; fetal anatomic survey: will be ordered at next visit. Problem list reviewed and updated. The nature of [facility] with multiple MDs and other Advanced Practice Providers was explained to patient; also emphasized that residents, students are part of our team.  2. Dichorionic diamniotic twin pregnancy in first trimester Discussed increased antenatal testing To start prenatal ASA next week  Routine obstetric precautions reviewed. Return in 4 weeks (on 02/17/2021) for [facility], ob visit.

## 2021-01-21 DIAGNOSIS — Z2839 Other underimmunization status: Secondary | ICD-10-CM | POA: Insufficient documentation

## 2021-01-21 DIAGNOSIS — O09899 Supervision of other high risk pregnancies, unspecified trimester: Secondary | ICD-10-CM | POA: Insufficient documentation

## 2021-01-21 LAB — OBSTETRIC PANEL, INCLUDING HIV
Antibody Screen: NEGATIVE
Basophils Absolute: 0 10*3/uL (ref 0.0–0.2)
Basos: 0 %
EOS (ABSOLUTE): 0.3 10*3/uL (ref 0.0–0.4)
Eos: 3 %
HIV Screen 4th Generation wRfx: NONREACTIVE
Hematocrit: 38.8 % (ref 34.0–46.6)
Hemoglobin: 13.3 g/dL (ref 11.1–15.9)
Hepatitis B Surface Ag: NEGATIVE
Immature Grans (Abs): 0 10*3/uL (ref 0.0–0.1)
Immature Granulocytes: 0 %
Lymphocytes Absolute: 1.5 10*3/uL (ref 0.7–3.1)
Lymphs: 18 %
MCH: 31.4 pg (ref 26.6–33.0)
MCHC: 34.3 g/dL (ref 31.5–35.7)
MCV: 92 fL (ref 79–97)
Monocytes Absolute: 0.8 10*3/uL (ref 0.1–0.9)
Monocytes: 10 %
Neutrophils Absolute: 5.7 10*3/uL (ref 1.4–7.0)
Neutrophils: 69 %
Platelets: 282 10*3/uL (ref 150–450)
RBC: 4.24 x10E6/uL (ref 3.77–5.28)
RDW: 13.9 % (ref 11.7–15.4)
RPR Ser Ql: NONREACTIVE
Rh Factor: POSITIVE
Rubella Antibodies, IGG: <0.9 index — ABNORMAL LOW (ref >0.99)
WBC: 8.4 10*3/uL (ref 3.4–10.8)

## 2021-01-21 LAB — CYTOLOGY - PAP
Comment: NEGATIVE
Diagnosis: NEGATIVE
High risk HPV: NEGATIVE

## 2021-01-21 LAB — CERVICOVAGINAL ANCILLARY ONLY
Chlamydia: NEGATIVE
Comment: NEGATIVE
Comment: NEGATIVE
Comment: NORMAL
Neisseria Gonorrhea: NEGATIVE
Trichomonas: NEGATIVE

## 2021-01-21 LAB — HEPATITIS C ANTIBODY: Hep C Virus Ab: <0.1 s/co ratio (ref 0.0–0.9)

## 2021-01-25 ENCOUNTER — Telehealth: Payer: Self-pay | Admitting: Family Medicine

## 2021-01-25 DIAGNOSIS — R8271 Bacteriuria: Secondary | ICD-10-CM | POA: Insufficient documentation

## 2021-01-25 LAB — CULTURE, OB URINE

## 2021-01-25 LAB — URINE CULTURE, OB REFLEX

## 2021-01-25 MED ORDER — AMOXICILLIN-POT CLAVULANATE 875-125 MG PO TABS: 1.0000 | ORAL_TABLET | Freq: Two times a day (BID) | ORAL | 0 refills | Status: AC

## 2021-01-25 NOTE — Telephone Encounter (Signed)
Please let patient know her urine culture was positive for GBS, I have sent an antibiotic to her pharmacy and we will re-test at her next visit.   Please also assist with mychart so that we can more easily get in touch with her.

## 2021-01-25 NOTE — Addendum Note (Signed)
Addended by: Merian Capron on: 01/25/2021 10:24 AM   Modules accepted: Orders

## 2021-01-25 NOTE — Telephone Encounter (Signed)
Patient inform of test results and recommendations. 

## 2021-01-27 ENCOUNTER — Encounter: Payer: Self-pay | Admitting: Obstetrics and Gynecology

## 2021-02-01 ENCOUNTER — Encounter: Payer: Self-pay | Admitting: Obstetrics and Gynecology

## 2021-02-17 ENCOUNTER — Ambulatory Visit (INDEPENDENT_AMBULATORY_CARE_PROVIDER_SITE_OTHER): Payer: Medicaid Other | Admitting: Family Medicine

## 2021-02-17 ENCOUNTER — Other Ambulatory Visit: Payer: Self-pay

## 2021-02-17 ENCOUNTER — Encounter: Payer: Self-pay | Admitting: Family Medicine

## 2021-02-17 VITALS — BP 123/78 | HR 108 | Wt 259.0 lb

## 2021-02-17 DIAGNOSIS — R8271 Bacteriuria: Secondary | ICD-10-CM

## 2021-02-17 DIAGNOSIS — Z3480 Encounter for supervision of other normal pregnancy, unspecified trimester: Secondary | ICD-10-CM

## 2021-02-17 DIAGNOSIS — O09899 Supervision of other high risk pregnancies, unspecified trimester: Secondary | ICD-10-CM

## 2021-02-17 DIAGNOSIS — Z2839 Other underimmunization status: Secondary | ICD-10-CM

## 2021-02-17 DIAGNOSIS — O30042 Twin pregnancy, dichorionic/diamniotic, second trimester: Secondary | ICD-10-CM

## 2021-02-17 NOTE — Progress Notes (Signed)
Pt had a few days of dark brown spotting when using restroom.  Pt also having some lower pelvic pain.

## 2021-02-17 NOTE — Patient Instructions (Signed)

## 2021-02-17 NOTE — Progress Notes (Signed)
   Subjective:  Shyane Rahming is a 30 y.o. G4P1021 at [redacted]w[redacted]d being seen today for ongoing prenatal care.  She is currently monitored for the following issues for this high-risk pregnancy and has Pregnancy; SVD (spontaneous vaginal delivery); Supervision of other normal pregnancy, antepartum; Dichorionic diamniotic twin gestation; Rubella non-immune status, antepartum; and GBS bacteriuria on their problem list.  Patient reports  cramps, brown spotting that has resolved .  Contractions: Not present. Vag. Bleeding: None.  Movement: Present. Denies leaking of fluid.   The following portions of the patient's history were reviewed and updated as appropriate: allergies, current medications, past family history, past medical history, past social history, past surgical history and problem list. Problem list updated.  Objective:   Vitals:   02/17/21 1508  BP: 123/78  Pulse: (!) 108  Weight: 259 lb (117.5 kg)    Fetal Status: Fetal Heart Rate (bpm): 148/152   Movement: Present     General:  Alert, oriented and cooperative. Patient is in no acute distress.  Skin: Skin is warm and dry. No rash noted.   Cardiovascular: Normal heart rate noted  Respiratory: Normal respiratory effort, no problems with respiration noted  Abdomen: Soft, gravid, appropriate for gestational age. Pain/Pressure: Absent     Pelvic: Vag. Bleeding: None     Cervical exam deferred        Extremities: Normal range of motion.     Mental Status: Normal mood and affect. Normal behavior. Normal judgment and thought content.   Urinalysis:      Assessment and Plan:  Pregnancy: G4P1021 at [redacted]w[redacted]d  1. Supervision of other normal pregnancy, antepartum BP normal FHRs normal, good fetal movement seen in both twins Not compliant with aspirin, stressed importance of compliance to obtain benefit of reduced risk  2. Dichorionic diamniotic twin pregnancy in second trimester AFP today Anatomy scan ordered  3. GBS bacteriuria   4.  Rubella non-immune status, antepartum   Preterm labor symptoms and general obstetric precautions including but not limited to vaginal bleeding, contractions, leaking of fluid and fetal movement were reviewed in detail with the patient. Please refer to After Visit Summary for other counseling recommendations.  Return in 4 weeks (on 03/17/2021) for [facility], ob visit.   Venora Maples, MD

## 2021-02-19 LAB — AFP, SERUM, OPEN SPINA BIFIDA
AFP MoM: 2.97
AFP Value: 70.9 ng/mL
Gest. Age on Collection Date: 15.6 weeks
Maternal Age At EDD: 29.1 yr
OSBR Risk 1 IN: 385
Test Results:: NEGATIVE
Weight: 259 [lb_av]

## 2021-03-04 ENCOUNTER — Inpatient Hospital Stay (HOSPITAL_BASED_OUTPATIENT_CLINIC_OR_DEPARTMENT_OTHER): Payer: Medicaid Other

## 2021-03-04 ENCOUNTER — Inpatient Hospital Stay (HOSPITAL_COMMUNITY)
Admission: AD | Admit: 2021-03-04 | Discharge: 2021-03-04 | Disposition: A | Discharge status: Home / Self Care | Payer: Medicaid Other | Attending: Obstetrics & Gynecology | Admitting: Obstetrics & Gynecology

## 2021-03-04 ENCOUNTER — Encounter (HOSPITAL_COMMUNITY): Payer: Self-pay | Admitting: Obstetrics & Gynecology

## 2021-03-04 ENCOUNTER — Other Ambulatory Visit: Payer: Self-pay

## 2021-03-04 ENCOUNTER — Telehealth: Payer: Self-pay

## 2021-03-04 DIAGNOSIS — Z3A18 18 weeks gestation of pregnancy: Secondary | ICD-10-CM

## 2021-03-04 DIAGNOSIS — O322XX2 Maternal care for transverse and oblique lie, fetus 2: Secondary | ICD-10-CM | POA: Diagnosis not present

## 2021-03-04 DIAGNOSIS — Z8759 Personal history of other complications of pregnancy, childbirth and the puerperium: Secondary | ICD-10-CM | POA: Insufficient documentation

## 2021-03-04 DIAGNOSIS — O30042 Twin pregnancy, dichorionic/diamniotic, second trimester: Secondary | ICD-10-CM

## 2021-03-04 DIAGNOSIS — O322XX1 Maternal care for transverse and oblique lie, fetus 1: Secondary | ICD-10-CM

## 2021-03-04 DIAGNOSIS — O4692 Antepartum hemorrhage, unspecified, second trimester: Secondary | ICD-10-CM | POA: Diagnosis not present

## 2021-03-04 DIAGNOSIS — O468X2 Other antepartum hemorrhage, second trimester: Secondary | ICD-10-CM

## 2021-03-04 DIAGNOSIS — Z7982 Long term (current) use of aspirin: Secondary | ICD-10-CM | POA: Diagnosis not present

## 2021-03-04 DIAGNOSIS — Z20822 Contact with and (suspected) exposure to covid-19: Secondary | ICD-10-CM | POA: Diagnosis not present

## 2021-03-04 DIAGNOSIS — Z87891 Personal history of nicotine dependence: Secondary | ICD-10-CM | POA: Insufficient documentation

## 2021-03-04 DIAGNOSIS — O208 Other hemorrhage in early pregnancy: Secondary | ICD-10-CM | POA: Insufficient documentation

## 2021-03-04 DIAGNOSIS — O4693 Antepartum hemorrhage, unspecified, third trimester: Secondary | ICD-10-CM

## 2021-03-04 DIAGNOSIS — O418X21 Other specified disorders of amniotic fluid and membranes, second trimester, fetus 1: Secondary | ICD-10-CM

## 2021-03-04 LAB — WET PREP, GENITAL
Clue Cells Wet Prep HPF POC: NONE SEEN
Sperm: NONE SEEN
Trich, Wet Prep: NONE SEEN
WBC, Wet Prep HPF POC: NONE SEEN
Yeast Wet Prep HPF POC: NONE SEEN

## 2021-03-04 LAB — COMPREHENSIVE METABOLIC PANEL
ALT: 15 U/L (ref 0–44)
AST: 12 U/L — ABNORMAL LOW (ref 15–41)
Albumin: 3 g/dL — ABNORMAL LOW (ref 3.5–5.0)
Alkaline Phosphatase: 40 U/L (ref 38–126)
Anion gap: 7 (ref 5–15)
BUN: 5 mg/dL — ABNORMAL LOW (ref 6–20)
CO2: 21 mmol/L — ABNORMAL LOW (ref 22–32)
Calcium: 9.5 mg/dL (ref 8.9–10.3)
Chloride: 106 mmol/L (ref 98–111)
Creatinine, Ser: 0.44 mg/dL (ref 0.44–1.00)
GFR, Estimated: >60 mL/min (ref >60)
Glucose, Bld: 80 mg/dL (ref 70–99)
Potassium: 3.6 mmol/L (ref 3.5–5.1)
Sodium: 134 mmol/L — ABNORMAL LOW (ref 135–145)
Total Bilirubin: 0.5 mg/dL (ref 0.3–1.2)
Total Protein: 6.2 g/dL — ABNORMAL LOW (ref 6.5–8.1)

## 2021-03-04 LAB — CBC
HCT: 34.9 % — ABNORMAL LOW (ref 36.0–46.0)
Hemoglobin: 12.1 g/dL (ref 12.0–15.0)
MCH: 31.2 pg (ref 26.0–34.0)
MCHC: 34.7 g/dL (ref 30.0–36.0)
MCV: 89.9 fL (ref 80.0–100.0)
Platelets: 315 10*3/uL (ref 150–400)
RBC: 3.88 MIL/uL (ref 3.87–5.11)
RDW: 13.3 % (ref 11.5–15.5)
WBC: 8.4 10*3/uL (ref 4.0–10.5)
nRBC: 0 % (ref 0.0–0.2)

## 2021-03-04 LAB — TYPE AND SCREEN
ABO/RH(D): O POS
Antibody Screen: NEGATIVE

## 2021-03-04 LAB — DIC (DISSEMINATED INTRAVASCULAR COAGULATION)PANEL
D-Dimer, Quant: 0.42 ug/mL-FEU (ref 0.00–0.50)
Fibrinogen: 506 mg/dL — ABNORMAL HIGH (ref 210–475)
INR: 1 (ref 0.8–1.2)
Platelets: 323 10*3/uL (ref 150–400)
Prothrombin Time: 13.4 seconds (ref 11.4–15.2)
Smear Review: NONE SEEN
aPTT: 29 seconds (ref 24–36)

## 2021-03-04 LAB — SARS CORONAVIRUS 2 (TAT 6-24 HRS): SARS Coronavirus 2: NEGATIVE

## 2021-03-04 NOTE — MAU Provider Note (Addendum)
History    [id number]  Arrival date and time: 03/04/21 1234   Chief Complaint  Patient presents with   Abdominal Pain   Vaginal Bleeding   HPI Crystal Cannon is a 30 y.o. at 77w0dby 6 wk U/S with no relevant PMHx, who presents for vaginal bleeding. At 0530 this morning, pt had onset of vaginal bleeding gushing down her leg in addition to lower stomach pain. The stomach pain has not improved but the bleeding has. Pt has had spotting before but not to the extent of her current bleeding. Last time she had intercourse was yesterday. No hx of abd surgery. Has had one miscarriage before in 2012.   Had similar bleeding in June, but bleeding was not as much. She went to the MAU and was cleared to go home after an U/S showed no problems.    [facility]  Review of records from Care Everywhere: None pertinent   Vaginal bleeding: Yes LOF: No Fetal Movement: Flutters  Contractions: No  --/--/O POS (07/14 1506)  OB History     Gravida  4   Para  1   Term  1   Preterm      AB  2   Living  1      SAB  1   IAB  1   Ectopic      Multiple      Live Births  1          History reviewed. No pertinent past medical history.  History reviewed. No pertinent surgical history.  History reviewed. No pertinent family history.  Social History   Socioeconomic History   Marital status: Single    Spouse name: Not on file   Number of children: Not on file   Years of education: Not on file   Highest education level: Not on file  Occupational History   Not on file  Tobacco Use   Smoking status: Former    Packs/day: 0.25    Types: Cigarettes    Quit date: 2015    Years since quitting: 7.5   Smokeless tobacco: Never  Vaping Use   Vaping Use: Never used  Substance and Sexual Activity   Alcohol use: No   Drug use: No   Sexual activity: Yes  Other Topics Concern   Not on file  Social History Narrative   Not on file   Social  Determinants of Health   Financial Resource Strain: Not on file  Food Insecurity: Not on file  Transportation Needs: Not on file  Physical Activity: Not on file  Stress: Not on file  Social Connections: Not on file  Intimate Partner Violence: Not on file    No Known Allergies  No current facility-administered medications on file prior to encounter.   Current Outpatient Medications on File Prior to Encounter  Medication Sig Dispense Refill   aspirin EC 81 MG tablet Take 1 tablet (81 mg total) by mouth daily. Swallow whole. 30 tablet 11   loratadine (CLARITIN) 10 MG tablet Take 10 mg by mouth daily.     Prenatal Vit-Fe Fumarate-FA (PRENATAL MULTIVITAMIN) TABS tablet Take 1 tablet by mouth daily at 12 noon.     Blood Pressure Monitoring (BLOOD PRESSURE KIT) DEVI 1 kit by Does not apply route as needed. Large Cuff 1 each 0   ROS Pertinent positives and negative per HPI, all others reviewed and negative  Physical Exam   BP 139/82 (BP Location: Right  Arm)   Pulse 86   Temp 98 F (36.7 C) (Oral)   Resp 16   Ht 5' 9"  (1.753 m)   Wt 117.6 kg   LMP 10/29/2020   SpO2 100%   BMI 38.28 kg/m   Physical Exam General: Alert and oriented in no apparent distress Heart: Regular rate and rhythm with no murmurs appreciated Lungs: Normal Respiratory effort  Abdomen: Bowel sounds present, Mild TTP over lower quadrants  Skin: Warm and dry Extremities: No lower extremity edema Speculum Exam: Cervix visually closed, blood tinged mucus present. Blood present in vaginal vault. No evidence of masses or erosions.   Bedside Ultrasound Pt informed that the ultrasound is considered a limited OB ultrasound and is not intended to be a complete ultrasound exam.  Patient also informed that the ultrasound is not being completed with the intent of assessing for fetal or placental anomalies or any pelvic abnormalities.  Explained that the purpose of today's ultrasound is to assess for   placental location .   Patient acknowledges the purpose of the exam and the limitations of the study.    My interpretation: Difficult to visualize the entire placenta, ?low lying?. Twin intrauterine pregnancy present.   Labs Results for orders placed or performed during the hospital encounter of 03/04/21 (from the past 24 hour(s))  Wet prep, genital     Status: None   Collection Time: 03/04/21  2:49 PM   Specimen: PATH Cytology Cervicovaginal Ancillary Only  Result Value Ref Range   Yeast Wet Prep HPF POC NONE SEEN NONE SEEN   Trich, Wet Prep NONE SEEN NONE SEEN   Clue Cells Wet Prep HPF POC NONE SEEN NONE SEEN   WBC, Wet Prep HPF POC NONE SEEN NONE SEEN   Sperm NONE SEEN   CBC     Status: Abnormal   Collection Time: 03/04/21  3:06 PM  Result Value Ref Range   WBC 8.4 4.0 - 10.5 K/uL   RBC 3.88 3.87 - 5.11 MIL/uL   Hemoglobin 12.1 12.0 - 15.0 g/dL   HCT 34.9 (L) 36.0 - 46.0 %   MCV 89.9 80.0 - 100.0 fL   MCH 31.2 26.0 - 34.0 pg   MCHC 34.7 30.0 - 36.0 g/dL   RDW 13.3 11.5 - 15.5 %   Platelets 315 150 - 400 K/uL   nRBC 0.0 0.0 - 0.2 %  Comprehensive metabolic panel     Status: Abnormal   Collection Time: 03/04/21  3:06 PM  Result Value Ref Range   Sodium 134 (L) 135 - 145 mmol/L   Potassium 3.6 3.5 - 5.1 mmol/L   Chloride 106 98 - 111 mmol/L   CO2 21 (L) 22 - 32 mmol/L   Glucose, Bld 80 70 - 99 mg/dL   BUN 5 (L) 6 - 20 mg/dL   Creatinine, Ser 0.44 0.44 - 1.00 mg/dL   Calcium 9.5 8.9 - 10.3 mg/dL   Total Protein 6.2 (L) 6.5 - 8.1 g/dL   Albumin 3.0 (L) 3.5 - 5.0 g/dL   AST 12 (L) 15 - 41 U/L   ALT 15 0 - 44 U/L   Alkaline Phosphatase 40 38 - 126 U/L   Total Bilirubin 0.5 0.3 - 1.2 mg/dL   GFR, Estimated >60 >60 mL/min   Anion gap 7 5 - 15  Type and screen [place]     Status: None   Collection Time: 03/04/21  3:06 PM  Result Value Ref Range   ABO/RH(D) Jenetta Downer  POS    Antibody Screen NEG    Sample Expiration      03/07/2021,2359 Performed at [facility], [place]  [address] [place], [place] [address]   DIC Panel ONCE - STAT     Status: Abnormal   Collection Time: 03/04/21  3:06 PM  Result Value Ref Range   Prothrombin Time 13.4 11.4 - 15.2 seconds   INR 1.0 0.8 - 1.2   aPTT 29 24 - 36 seconds   Fibrinogen 506 (H) 210 - 475 mg/dL   D-Dimer, Quant 0.42 0.00 - 0.50 ug/mL-FEU   Platelets 323 150 - 400 K/uL   Smear Review NO SCHISTOCYTES SEEN     Imaging [place] MFM OB Limited  Result Date: 03/04/2021 ----------------------------------------------------------------------  OBSTETRICS REPORT                       (Signed Final 03/04/2021 05:17 pm) ---------------------------------------------------------------------- Patient Info  ID #:       [id number]                          D.O.B.:  12/08/1990 (29 yrs)  Name:       Crystal Cannon                  Visit Date: 03/04/2021 03:32 pm ---------------------------------------------------------------------- Performed By  Attending:        Sander Nephew      Ref. Address:     [address]                                                             [place], [place]  Performed By:     Nathen May       [facility]                    [place]  Referred By:      Clarnce Flock MD ---------------------------------------------------------------------- Orders  #  Description  Code        Ordered By  1  [place] MFM OB LIMITED                     [id number]    Adanya Sosinski ----------------------------------------------------------------------  #  Order #                     Accession #                Episode #  1  [id number]                   [id number]                 [id number] ---------------------------------------------------------------------- Indications  [redacted]  weeks gestation of pregnancy                Z3A.18  Vaginal bleeding in pregnancy, second          O46.92  trimester  Twin pregnancy, di/di, second trimester        O30.042 ---------------------------------------------------------------------- Fetal Evaluation (Fetus A)  Num Of Fetuses:         2  Fetal Heart Rate(bpm):  153  Cardiac Activity:       Observed  Fetal Lie:              Lower Fetus  Presentation:           Transverse, head to maternal left  Placenta:               Posterior  P. Cord Insertion:      Visualized, central  Membrane Desc:      Dividing Membrane seen - Dichorionic. ---------------------------------------------------------------------- Gestational Age (Fetus A)  LMP:           18w 0d        Date:  10/29/20                 EDD:   08/05/21  Best:          Renae Fickle 0d     Det. By:  LMP  (10/29/20)          EDD:   08/05/21 ---------------------------------------------------------------------- Fetal Evaluation (Fetus B)  Num Of Fetuses:         2  Fetal Heart Rate(bpm):  150  Cardiac Activity:       Observed  Fetal Lie:              Upper Fetus  Presentation:           Transverse, head to maternal left  Placenta:               Anterior  P. Cord Insertion:      Visualized  Membrane Desc:      Dividing Membrane seen - Dichorionic.  Comment:    Small subchorionic hemorrhage noted, 2.9 cm ---------------------------------------------------------------------- Gestational Age (Fetus B)  LMP:           18w 0d        Date:  10/29/20                 EDD:   08/05/21  Best:          Renae Fickle 0d     Det. By:  LMP  (10/29/20)          EDD:   08/05/21 ---------------------------------------------------------------------- Cervix Uterus Adnexa  Cervix  Length:  4.07  cm.  Area of clot seen at internal cervical os. ---------------------------------------------------------------------- Impression  Limited exam to assess vaginal bleeding in diamniotic  dichorionic twin pregnancy.  Good fetal movement and amniotic  fluid volume.  There is a blood clot at the internal os.  Subamniotic hemorrhage observed on Twin B's placenta.  Good fetal heart rates in Twin A and B. ---------------------------------------------------------------------- Recommendations  Detailed anatomy at first available appointment.  Counsel Ms. Patman regarding increased risk of preterm  rupture of membranes and preterm labor  Consider MFM consultation. ----------------------------------------------------------------------               Sander Nephew, MD Electronically Signed Final Report   03/04/2021 05:17 pm ----------------------------------------------------------------------   MAU Course  Procedures Lab Orders  Wet prep, genital  SARS Coronavirus 2 (TAT 6-24 hrs)  CBC  Comprehensive metabolic panel  DIC Panel ONCE - STAT   No orders of the defined types were placed in this encounter.  Imaging Orders  [place] MFM OB Transvaginal  US MFM OB Limited    Assessment and Plan   #Subchorionic hemorrhage #Vaginal bleeding in pregnancy, second trimester -Subchorionic hemorrhage present on U/S - CBC- H/H 12.1/34.9 -CMP and DIC panel wnl  -Counseled pt on what to expect during this pregnancy with [facility] and what to avoid to possibly prevent complications. Pelvic rest not shown to improve outcomes but may provide peace of mind.  -Reviewed increased risk of PPROM and PTL in this scenario -Has MFM scan scheduled for later this month already  Discharge to home in stable condition  Erskine Emery, PGY1   ATTENDING ATTESTATION  I have seen and examined this patient and agree with the above documentation in the resident's note except as below.  Above note edited for accuracy and clarity  Clarnce Flock, MD/[facility] [employer] [facility] (Faculty Practice) 03/04/2021, 6:17 PM

## 2021-03-04 NOTE — Telephone Encounter (Signed)
Received message from pt c/o bright red VB and cramping 5/10 this morning.  Last IC was last night  Pt states blood is filling the toilet while voiding. She reports the bleeding is flowing like a period. She is wearing a maxi pad and it is full. Pt advised to report to MAU for an evaluation.

## 2021-03-04 NOTE — MAU Note (Signed)
Child visitation discussed. Encouraged pt to call someone to get child.

## 2021-03-04 NOTE — MAU Note (Signed)
Woke up at about 5, got up to pee.  When she sat up, blood started running out.  Ran to the bathroom, had a trail of blood, when she sat down, it just gushed.  Still bleeding and having really bad pains. no known placental issues on [place]. Preg with twins.

## 2021-03-05 LAB — GC/CHLAMYDIA PROBE AMP (~~LOC~~) NOT AT ARMC
Chlamydia: NEGATIVE
Comment: NEGATIVE
Comment: NORMAL
Neisseria Gonorrhea: NEGATIVE

## 2021-03-17 ENCOUNTER — Ambulatory Visit: Payer: Medicaid Other | Admitting: *Deleted

## 2021-03-17 ENCOUNTER — Encounter: Payer: Medicaid Other | Admitting: Obstetrics

## 2021-03-17 ENCOUNTER — Other Ambulatory Visit: Payer: Self-pay

## 2021-03-17 ENCOUNTER — Ambulatory Visit (HOSPITAL_BASED_OUTPATIENT_CLINIC_OR_DEPARTMENT_OTHER): Payer: Medicaid Other | Admitting: Maternal & Fetal Medicine

## 2021-03-17 ENCOUNTER — Encounter: Payer: Self-pay | Admitting: Obstetrics and Gynecology

## 2021-03-17 ENCOUNTER — Ambulatory Visit (INDEPENDENT_AMBULATORY_CARE_PROVIDER_SITE_OTHER): Payer: Medicaid Other | Admitting: Obstetrics and Gynecology

## 2021-03-17 ENCOUNTER — Other Ambulatory Visit: Payer: Self-pay | Admitting: *Deleted

## 2021-03-17 ENCOUNTER — Ambulatory Visit: Payer: Medicaid Other | Attending: Family Medicine

## 2021-03-17 ENCOUNTER — Encounter: Payer: Self-pay | Admitting: *Deleted

## 2021-03-17 VITALS — BP 115/79 | HR 102 | Wt 262.0 lb

## 2021-03-17 VITALS — BP 113/63 | HR 92

## 2021-03-17 DIAGNOSIS — Z3A19 19 weeks gestation of pregnancy: Secondary | ICD-10-CM | POA: Diagnosis not present

## 2021-03-17 DIAGNOSIS — Z2839 Other underimmunization status: Secondary | ICD-10-CM

## 2021-03-17 DIAGNOSIS — O30042 Twin pregnancy, dichorionic/diamniotic, second trimester: Secondary | ICD-10-CM | POA: Diagnosis present

## 2021-03-17 DIAGNOSIS — E669 Obesity, unspecified: Secondary | ICD-10-CM | POA: Diagnosis not present

## 2021-03-17 DIAGNOSIS — Z363 Encounter for antenatal screening for malformations: Secondary | ICD-10-CM | POA: Diagnosis not present

## 2021-03-17 DIAGNOSIS — R8271 Bacteriuria: Secondary | ICD-10-CM

## 2021-03-17 DIAGNOSIS — Z3689 Encounter for other specified antenatal screening: Secondary | ICD-10-CM | POA: Diagnosis not present

## 2021-03-17 DIAGNOSIS — Z3480 Encounter for supervision of other normal pregnancy, unspecified trimester: Secondary | ICD-10-CM

## 2021-03-17 DIAGNOSIS — O99212 Obesity complicating pregnancy, second trimester: Secondary | ICD-10-CM | POA: Insufficient documentation

## 2021-03-17 NOTE — Progress Notes (Signed)
Starting to feel some movement. No complaints.

## 2021-03-17 NOTE — Progress Notes (Signed)
MFM Consultation  Twin intrauterine pregnancy with Diamniotic Dichorionic pregnancy. Normal anatomy with good amniotic fluid and fetal movement was observed in Twin A and B. Suboptimal views of the both twin anatomy was seen secondary to fetal position and multiple gestation. Discordance is 19%.   I reviewed the normal nature of today's ultrasound. Ms. Dinse conveyed that she is taking low dose aspirin for preeclampsia prevention.  We reviewed the sonographic findings and limitations of ultrasound. The potential risks associated with a twin gestation were discussed.  This discussion included a review of the increased risk of miscarriages, anomalies, preterm labor, and/or delivery, malpresentation, delivery via cesarean section, gestational diabetes, and/or preeclampsia.  With regards to fetal risks, there is an increased risk for fetal growth restriction of one or both twins, preterm labor, and associated morbidity, and intrauterine fetal demise.    We recommend growth scans every 4 weeks starting at 24 weeks with the initation of weekly antenatal testing in the form of twice weekly NST or weekly BPP should abnormal fetal growth or intertwin discordance of greater than 20-25% is noted.   Following counseling, all questions were addressed.   I spent 30 minutes with > 50% in face to face consultation.  Novella Olive, MD

## 2021-03-17 NOTE — Progress Notes (Signed)
Subjective:  Crystal Cannon is a 30 y.o. G4P1021 at [redacted]w[redacted]d being seen today for ongoing prenatal care.  She is currently monitored for the following issues for this high-risk pregnancy and has Supervision of other normal pregnancy, antepartum; Dichorionic diamniotic twin gestation; Rubella non-immune status, antepartum; and GBS bacteriuria on their problem list.  Patient reports no complaints.  Contractions: Not present. Vag. Bleeding: None.  Movement: Increased. Denies leaking of fluid.   The following portions of the patient's history were reviewed and updated as appropriate: allergies, current medications, past family history, past medical history, past social history, past surgical history and problem list. Problem list updated.  Objective:   Vitals:   03/17/21 1510  BP: 115/79  Pulse: (!) 102  Weight: 262 lb (118.8 kg)    Fetal Status: Fetal Heart Rate (bpm): A: 152/B: 147   Movement: Increased     General:  Alert, oriented and cooperative. Patient is in no acute distress.  Skin: Skin is warm and dry. No rash noted.   Cardiovascular: Normal heart rate noted  Respiratory: Normal respiratory effort, no problems with respiration noted  Abdomen: Soft, gravid, appropriate for gestational age. Pain/Pressure: Absent     Pelvic:  Cervical exam deferred        Extremities: Normal range of motion.  Edema: None  Mental Status: Normal mood and affect. Normal behavior. Normal judgment and thought content.   Urinalysis:      Assessment and Plan:  Pregnancy: G4P1021 at [redacted]w[redacted]d  1. Supervision of other normal pregnancy, antepartum Stable  2. Dichorionic diamniotic twin pregnancy in second trimester Stable Anatomy scan today Serial growth scans as per MFM BASA qd  3. Rubella non-immune status, antepartum Vaccine PP  4. GBS bacteriuria Tx while in labor  Preterm labor symptoms and general obstetric precautions including but not limited to vaginal bleeding, contractions, leaking of  fluid and fetal movement were reviewed in detail with the patient. Please refer to After Visit Summary for other counseling recommendations.  Return in about 4 weeks (around 04/14/2021) for OB visit, face to face, MD only.   Hermina Staggers, MD

## 2021-03-17 NOTE — Patient Instructions (Signed)

## 2021-03-24 ENCOUNTER — Ambulatory Visit: Payer: Medicaid Other | Attending: Maternal & Fetal Medicine

## 2021-03-24 ENCOUNTER — Telehealth: Payer: Self-pay | Admitting: Obstetrics and Gynecology

## 2021-03-24 NOTE — Telephone Encounter (Signed)
PC to patient to offer to reschedule genetic counseling visit, as she did not show today.  No answer and no voicemail.  We are happy to reschedule if desired.  We can be reached at [phone].  Cherly Anderson, MS, CGC

## 2021-04-13 ENCOUNTER — Other Ambulatory Visit: Payer: Self-pay

## 2021-04-13 ENCOUNTER — Ambulatory Visit: Payer: Medicaid Other | Attending: Maternal & Fetal Medicine

## 2021-04-13 ENCOUNTER — Encounter: Payer: Self-pay | Admitting: *Deleted

## 2021-04-13 ENCOUNTER — Ambulatory Visit: Payer: Medicaid Other | Admitting: *Deleted

## 2021-04-13 VITALS — BP 118/65 | HR 103

## 2021-04-13 DIAGNOSIS — Z362 Encounter for other antenatal screening follow-up: Secondary | ICD-10-CM | POA: Diagnosis not present

## 2021-04-13 DIAGNOSIS — Z3A23 23 weeks gestation of pregnancy: Secondary | ICD-10-CM

## 2021-04-13 DIAGNOSIS — O322XX2 Maternal care for transverse and oblique lie, fetus 2: Secondary | ICD-10-CM

## 2021-04-13 DIAGNOSIS — O99212 Obesity complicating pregnancy, second trimester: Secondary | ICD-10-CM

## 2021-04-13 DIAGNOSIS — E669 Obesity, unspecified: Secondary | ICD-10-CM | POA: Diagnosis not present

## 2021-04-13 DIAGNOSIS — O30042 Twin pregnancy, dichorionic/diamniotic, second trimester: Secondary | ICD-10-CM

## 2021-04-13 DIAGNOSIS — O321XX1 Maternal care for breech presentation, fetus 1: Secondary | ICD-10-CM

## 2021-04-14 ENCOUNTER — Other Ambulatory Visit: Payer: Self-pay | Admitting: Maternal & Fetal Medicine

## 2021-04-14 DIAGNOSIS — O30042 Twin pregnancy, dichorionic/diamniotic, second trimester: Secondary | ICD-10-CM

## 2021-04-14 DIAGNOSIS — O99212 Obesity complicating pregnancy, second trimester: Secondary | ICD-10-CM

## 2021-04-16 ENCOUNTER — Encounter: Payer: Self-pay | Admitting: Obstetrics and Gynecology

## 2021-04-16 ENCOUNTER — Other Ambulatory Visit: Payer: Self-pay

## 2021-04-16 ENCOUNTER — Ambulatory Visit (INDEPENDENT_AMBULATORY_CARE_PROVIDER_SITE_OTHER): Payer: Medicaid Other | Admitting: Obstetrics and Gynecology

## 2021-04-16 VITALS — BP 123/79 | HR 90 | Wt 268.0 lb

## 2021-04-16 DIAGNOSIS — O30049 Twin pregnancy, dichorionic/diamniotic, unspecified trimester: Secondary | ICD-10-CM

## 2021-04-16 DIAGNOSIS — O09899 Supervision of other high risk pregnancies, unspecified trimester: Secondary | ICD-10-CM

## 2021-04-16 DIAGNOSIS — Z3480 Encounter for supervision of other normal pregnancy, unspecified trimester: Secondary | ICD-10-CM

## 2021-04-16 DIAGNOSIS — Z2839 Other underimmunization status: Secondary | ICD-10-CM

## 2021-04-16 DIAGNOSIS — R8271 Bacteriuria: Secondary | ICD-10-CM

## 2021-04-16 NOTE — Progress Notes (Signed)
+   fetal movement. Pt has questions about ultrasound. Concerned with growth of Baby B.

## 2021-04-16 NOTE — Progress Notes (Signed)
   PRENATAL VISIT NOTE  Subjective:  Crystal Cannon is a 30 y.o. G4P1021 at [redacted]w[redacted]d being seen today for ongoing prenatal care.  She is currently monitored for the following issues for this high-risk pregnancy and has Supervision of other normal pregnancy, antepartum; Dichorionic diamniotic twin gestation; Rubella non-immune status, antepartum; and GBS bacteriuria on their problem list.  Patient reports no complaints.  Contractions: Not present. Vag. Bleeding: None.  Movement: Present. Denies leaking of fluid.   The following portions of the patient's history were reviewed and updated as appropriate: allergies, current medications, past family history, past medical history, past social history, past surgical history and problem list.   Objective:   Vitals:   04/16/21 1038  BP: 123/79  Pulse: 90  Weight: 268 lb (121.6 kg)    Fetal Status: Fetal Heart Rate (bpm): A: 141 B: 152   Movement: Present     General:  Alert, oriented and cooperative. Patient is in no acute distress.  Skin: Skin is warm and dry. No rash noted.   Cardiovascular: Normal heart rate noted  Respiratory: Normal respiratory effort, no problems with respiration noted  Abdomen: Soft, gravid, appropriate for gestational age.  Pain/Pressure: Absent     Pelvic: Cervical exam deferred        Extremities: Normal range of motion.  Edema: Trace  Mental Status: Normal mood and affect. Normal behavior. Normal judgment and thought content.   Assessment and Plan:  Pregnancy: G4P1021 at [redacted]w[redacted]d 1. Supervision of other normal pregnancy, antepartum Patient is doing well Reviewed recent growth ultrasound Third trimester labs and glucola next visit  2. Dichorionic diamniotic twin pregnancy, antepartum Follow up growth ultrasound  Continue ASA  3. GBS bacteriuria Prophylaxis in labor  4. Rubella non-immune status, antepartum Will offer pp  Preterm labor symptoms and general obstetric precautions including but not limited to  vaginal bleeding, contractions, leaking of fluid and fetal movement were reviewed in detail with the patient. Please refer to After Visit Summary for other counseling recommendations.   Return in about 4 weeks (around 05/14/2021) for in person, ROB, High risk, 2 hr glucola next visit.  Future Appointments  Date Time Provider Department Center  05/14/2021  9:30 AM [facility] NURSE [facility]  05/14/2021  9:45 AM [facility] US5 [facility] [place]  05/14/2021 10:45 AM [facility] NST [facility]    Catalina Antigua, MD

## 2021-05-14 ENCOUNTER — Encounter: Payer: Self-pay | Admitting: *Deleted

## 2021-05-14 ENCOUNTER — Other Ambulatory Visit: Payer: Self-pay

## 2021-05-14 ENCOUNTER — Ambulatory Visit: Payer: Medicaid Other | Attending: Obstetrics and Gynecology

## 2021-05-14 ENCOUNTER — Other Ambulatory Visit: Payer: Self-pay | Admitting: *Deleted

## 2021-05-14 ENCOUNTER — Ambulatory Visit: Payer: Medicaid Other | Admitting: *Deleted

## 2021-05-14 ENCOUNTER — Ambulatory Visit (HOSPITAL_BASED_OUTPATIENT_CLINIC_OR_DEPARTMENT_OTHER): Payer: Medicaid Other | Admitting: *Deleted

## 2021-05-14 VITALS — BP 128/73 | HR 85

## 2021-05-14 DIAGNOSIS — O99213 Obesity complicating pregnancy, third trimester: Secondary | ICD-10-CM

## 2021-05-14 DIAGNOSIS — O30042 Twin pregnancy, dichorionic/diamniotic, second trimester: Secondary | ICD-10-CM | POA: Diagnosis not present

## 2021-05-14 DIAGNOSIS — O30043 Twin pregnancy, dichorionic/diamniotic, third trimester: Secondary | ICD-10-CM

## 2021-05-14 DIAGNOSIS — E669 Obesity, unspecified: Secondary | ICD-10-CM | POA: Diagnosis not present

## 2021-05-14 DIAGNOSIS — Z3A28 28 weeks gestation of pregnancy: Secondary | ICD-10-CM | POA: Diagnosis not present

## 2021-05-14 DIAGNOSIS — O99212 Obesity complicating pregnancy, second trimester: Secondary | ICD-10-CM | POA: Insufficient documentation

## 2021-05-14 NOTE — Procedures (Signed)
Shanterria Haverstick 06-Aug-1991 [redacted]w[redacted]d  Fetus A Non-Stress Test Interpretation for 05/14/21  Indication:  Di/Di twins  Fetal Heart Rate A Mode: External Baseline Rate (A): 130 bpm Variability: Moderate Accelerations: 10 x 10 Decelerations: None Multiple birth?: Yes  Uterine Activity Mode: Palpation, Toco Contraction Frequency (min): None Resting Tone Palpated: Relaxed Resting Time: Adequate  Interpretation (Fetal Testing) Nonstress Test Interpretation: Reactive Comments: Dr. Parke Poisson reviewed tracing.  Jacqualyn Rayborn 13-Jun-1991 [redacted]w[redacted]d   Fetus B Non-Stress Test Interpretation for 05/14/21  Indication:  Di/Di twins  Fetal Heart Rate Fetus B Mode: External Baseline Rate (B): 125 BPM Variability: Moderate Accelerations: 10 x 10 Decelerations: None  Uterine Activity Mode: Palpation, Toco Contraction Frequency (min): None Resting Tone Palpated: Relaxed Resting Time: Adequate

## 2021-05-17 ENCOUNTER — Encounter: Payer: Self-pay | Admitting: Obstetrics and Gynecology

## 2021-05-17 ENCOUNTER — Other Ambulatory Visit: Payer: Medicaid Other

## 2021-05-17 ENCOUNTER — Other Ambulatory Visit: Payer: Self-pay

## 2021-05-17 ENCOUNTER — Ambulatory Visit (INDEPENDENT_AMBULATORY_CARE_PROVIDER_SITE_OTHER): Payer: Medicaid Other | Admitting: Obstetrics and Gynecology

## 2021-05-17 VITALS — BP 107/73 | HR 105 | Wt 266.2 lb

## 2021-05-17 DIAGNOSIS — O30043 Twin pregnancy, dichorionic/diamniotic, third trimester: Secondary | ICD-10-CM

## 2021-05-17 DIAGNOSIS — Z2839 Other underimmunization status: Secondary | ICD-10-CM

## 2021-05-17 DIAGNOSIS — R8271 Bacteriuria: Secondary | ICD-10-CM

## 2021-05-17 DIAGNOSIS — Z3480 Encounter for supervision of other normal pregnancy, unspecified trimester: Secondary | ICD-10-CM

## 2021-05-17 NOTE — Progress Notes (Signed)
   PRENATAL VISIT NOTE  Subjective:  Crystal Cannon is a 29 y.o. G4P1021 at [redacted]w[redacted]d being seen today for ongoing prenatal care.  She is currently monitored for the following issues for this high-risk pregnancy and has Supervision of other normal pregnancy, antepartum; Dichorionic diamniotic twin gestation; Rubella non-immune status, antepartum; and GBS bacteriuria on their problem list.  Patient reports no complaints.  Contractions: Not present. Vag. Bleeding: None.  Movement: Present. Denies leaking of fluid.   The following portions of the patient's history were reviewed and updated as appropriate: allergies, current medications, past family history, past medical history, past social history, past surgical history and problem list.   Objective:   Vitals:   05/17/21 0918  BP: 107/73  Pulse: (!) 105  Weight: 266 lb 3.2 oz (120.7 kg)    Fetal Status: Fetal Heart Rate (bpm): 135/141   Movement: Present     General:  Alert, oriented and cooperative. Patient is in no acute distress.  Skin: Skin is warm and dry. No rash noted.   Cardiovascular: Normal heart rate noted  Respiratory: Normal respiratory effort, no problems with respiration noted  Abdomen: Soft, gravid, appropriate for gestational age.  Pain/Pressure: Absent     Pelvic: Cervical exam deferred        Extremities: Normal range of motion.  Edema: None  Mental Status: Normal mood and affect. Normal behavior. Normal judgment and thought content.   Assessment and Plan:  Pregnancy: G4P1021 at [redacted]w[redacted]d 1. Supervision of other normal pregnancy, antepartum Patient is doing well without complaints Third trimester labs and glucola today Patient desires sterilization- form signed today - Glucose Tolerance, 2 Hours w/1 Hour - RPR - HIV antibody (with reflex) - CBC  2. Dichorionic diamniotic twin pregnancy in third trimester Follow up growth ultrasound  3. GBS bacteriuria Prophylaxis if labor  4. Rubella non-immune status,  antepartum Will offer pp  Preterm labor symptoms and general obstetric precautions including but not limited to vaginal bleeding, contractions, leaking of fluid and fetal movement were reviewed in detail with the patient. Please refer to After Visit Summary for other counseling recommendations.   No follow-ups on file.  Future Appointments  Date Time Provider Department Center  05/21/2021  9:30 AM [facility] NURSE [facility]  05/21/2021  9:45 AM [facility] NST [facility]  05/28/2021 10:30 AM [facility]URSE [facility]  05/28/2021 10:45 AM [facility] NST [facility]  06/04/2021  9:45 AM [facility] NST [facility]  06/04/2021 10:30 AM [facility] NURSE [facility]  06/04/2021 10:45 AM [facility] US6 [facility]  06/11/2021  9:30 AM [facility] NURSE [facility]  06/11/2021  9:45 AM [facility] US4 [facility]    Renada Cronin, MD

## 2021-05-18 LAB — CBC
Hematocrit: 34.6 % (ref 34.0–46.6)
Hemoglobin: 11.4 g/dL (ref 11.1–15.9)
MCH: 30.1 pg (ref 26.6–33.0)
MCHC: 32.9 g/dL (ref 31.5–35.7)
MCV: 91 fL (ref 79–97)
Platelets: 225 10*3/uL (ref 150–450)
RBC: 3.79 x10E6/uL (ref 3.77–5.28)
RDW: 12.5 % (ref 11.7–15.4)
WBC: 7.8 10*3/uL (ref 3.4–10.8)

## 2021-05-18 LAB — GLUCOSE TOLERANCE, 2 HOURS W/ 1HR
Glucose, 1 hour: 176 mg/dL (ref 65–179)
Glucose, 2 hour: 107 mg/dL (ref 65–152)
Glucose, Fasting: 82 mg/dL (ref 65–91)

## 2021-05-18 LAB — HIV ANTIBODY (ROUTINE TESTING W REFLEX): HIV Screen 4th Generation wRfx: NONREACTIVE

## 2021-05-18 LAB — RPR: RPR Ser Ql: NONREACTIVE

## 2021-05-21 ENCOUNTER — Ambulatory Visit: Payer: Medicaid Other | Attending: Obstetrics

## 2021-05-21 ENCOUNTER — Ambulatory Visit: Payer: Medicaid Other

## 2021-05-28 ENCOUNTER — Ambulatory Visit: Payer: Medicaid Other | Admitting: *Deleted

## 2021-05-28 ENCOUNTER — Ambulatory Visit: Payer: Medicaid Other | Attending: Obstetrics | Admitting: *Deleted

## 2021-05-28 ENCOUNTER — Other Ambulatory Visit: Payer: Self-pay

## 2021-05-28 VITALS — BP 108/66 | HR 72

## 2021-05-28 DIAGNOSIS — O30043 Twin pregnancy, dichorionic/diamniotic, third trimester: Secondary | ICD-10-CM

## 2021-05-28 DIAGNOSIS — Z3A30 30 weeks gestation of pregnancy: Secondary | ICD-10-CM | POA: Insufficient documentation

## 2021-05-28 NOTE — Procedures (Signed)
Crystal Cannon [birth year] [redacted]w[redacted]d  Fetus A Non-Stress Test Interpretation for 05/28/21  Indication:  Di/Di twins  Fetal Heart Rate A Mode: External Baseline Rate (A): 130 bpm Variability: Moderate Accelerations: 15 x 15 Decelerations: None Multiple birth?: Yes  Uterine Activity Mode: Palpation, Toco Contraction Frequency (min): Occas. UI Contraction Quality: Mild Resting Tone Palpated: Relaxed Resting Time: Adequate  Interpretation (Fetal Testing) Nonstress Test Interpretation: Reactive Comments: Dr. Parke Poisson reviewed tracing.  Crystal Cannon 03-29-1991 [redacted]w[redacted]d   Fetus B Non-Stress Test Interpretation for 05/28/21  Indication:  Di/Di twins  Fetal Heart Rate Fetus B Mode: External Baseline Rate (B): 130 BPM Variability: Moderate Accelerations: 15 x 15 Decelerations: None  Uterine Activity Mode: Palpation, Toco Contraction Frequency (min): Occas. UI Contraction Quality: Mild Resting Tone Palpated: Relaxed Resting Time: Adequate  Interpretation (Baby B - Fetal Testing) Nonstress Test Interpretation (Baby B): Reactive Comments (Baby B): Dr. Parke Poisson reviewed tracing.

## 2021-06-01 ENCOUNTER — Ambulatory Visit (INDEPENDENT_AMBULATORY_CARE_PROVIDER_SITE_OTHER): Payer: Medicaid Other | Admitting: Obstetrics & Gynecology

## 2021-06-01 ENCOUNTER — Other Ambulatory Visit: Payer: Self-pay

## 2021-06-01 VITALS — BP 133/85 | HR 88 | Wt 268.0 lb

## 2021-06-01 DIAGNOSIS — Z3480 Encounter for supervision of other normal pregnancy, unspecified trimester: Secondary | ICD-10-CM

## 2021-06-01 DIAGNOSIS — O30043 Twin pregnancy, dichorionic/diamniotic, third trimester: Secondary | ICD-10-CM

## 2021-06-01 NOTE — Progress Notes (Signed)
ROB 30.5wks Di-Di twins. No unusual complaints.

## 2021-06-01 NOTE — Progress Notes (Signed)
   PRENATAL VISIT NOTE  Subjective:  Crystal Cannon is a 30 y.o. G4P1021 at [redacted]w[redacted]d being seen today for ongoing prenatal care.  She is currently monitored for the following issues for this high-risk pregnancy and has Supervision of other normal pregnancy, antepartum; Dichorionic diamniotic twin gestation; Rubella non-immune status, antepartum; and GBS bacteriuria on their problem list.  Patient reports  pelvic pressure, difficult to wear seat belt .  Contractions: Irritability. Vag. Bleeding: None.  Movement: Present. Denies leaking of fluid.   The following portions of the patient's history were reviewed and updated as appropriate: allergies, current medications, past family history, past medical history, past social history, past surgical history and problem list.   Objective:   Vitals:   06/01/21 0951  BP: 133/85  Pulse: 88  Weight: 268 lb (121.6 kg)    Fetal Status: Fetal Heart Rate (bpm): A: 126/ B: 123   Movement: Present     General:  Alert, oriented and cooperative. Patient is in no acute distress.  Skin: Skin is warm and dry. No rash noted.   Cardiovascular: Normal heart rate noted  Respiratory: Normal respiratory effort, no problems with respiration noted  Abdomen: Soft, gravid, appropriate for gestational age.  Pain/Pressure: Absent     Pelvic: Cervical exam deferred        Extremities: Normal range of motion.  Edema: Trace  Mental Status: Normal mood and affect. Normal behavior. Normal judgment and thought content.   Assessment and Plan:  Pregnancy: G4P1021 at [redacted]w[redacted]d 1. Dichorionic diamniotic twin pregnancy in third trimester Br/Tr  2. Supervision of other normal pregnancy, antepartum Fetal growth followed by [place]  Preterm labor symptoms and general obstetric precautions including but not limited to vaginal bleeding, contractions, leaking of fluid and fetal movement were reviewed in detail with the patient. Please refer to After Visit Summary for other counseling  recommendations.   Return in about 1 week (around 06/08/2021).  Future Appointments  Date Time Provider Department Center  06/04/2021  9:45 AM [facility] NST [facility]  06/04/2021 10:30 AM [facility] NURSE [facility]  06/04/2021 10:45 AM [facility] US6 [facility]  06/11/2021  9:30 AM [facility] NURSE [facility]  06/11/2021  9:45 AM [facility] US4 [facility]    Scheryl Darter, MD

## 2021-06-03 ENCOUNTER — Telehealth: Payer: Self-pay | Admitting: *Deleted

## 2021-06-03 ENCOUNTER — Ambulatory Visit (INDEPENDENT_AMBULATORY_CARE_PROVIDER_SITE_OTHER): Payer: Medicaid Other | Admitting: *Deleted

## 2021-06-03 ENCOUNTER — Other Ambulatory Visit: Payer: Self-pay

## 2021-06-03 VITALS — BP 137/86 | HR 98 | Wt 269.2 lb

## 2021-06-03 DIAGNOSIS — Z3480 Encounter for supervision of other normal pregnancy, unspecified trimester: Secondary | ICD-10-CM

## 2021-06-03 DIAGNOSIS — Z3A31 31 weeks gestation of pregnancy: Secondary | ICD-10-CM

## 2021-06-03 DIAGNOSIS — O1493 Unspecified pre-eclampsia, third trimester: Secondary | ICD-10-CM

## 2021-06-03 NOTE — Progress Notes (Signed)
Subjective:  Odaly Sitzer is a 30 y.o. female here for BP check.   Hypertension ROS: home BP monitoring in range of 141-160's systolic over 88-97's diastolic, no TIA's, no chest pain on exertion, no dyspnea on exertion, and no swelling of ankles. Reports headache, blurry vision, bad "rib pain", possible UCs .   Objective:  LMP 10/29/2020   Appearance alert, well appearing, and in no distress, oriented to person, place, and time. Reports headache, blurry vision, bad "rib pain", possible UCs . General exam BP noted to be elevated in the office and at home.   Assessment:   Blood Pressure poorly controlled.   Plan:  The following changes are to be made: Patient directed to go to MAU for further evaluation of BP's, signs and symptoms of preelampsia per Dr. Alysia Penna . Dr. Crissie Reese, Faculty provider in MAU notified and will notify nursing staff.

## 2021-06-03 NOTE — Telephone Encounter (Signed)
Pt called to office with increase in BP today and states she just doesn't feel well today.  BP range from 140-160's/80-90's.  Pt denies any other symptoms today, some slight dizziness.    Pt was schedule for in office nurse visit today.  Advised to call if she cannot make appt.

## 2021-06-04 ENCOUNTER — Other Ambulatory Visit: Payer: Self-pay | Admitting: Obstetrics

## 2021-06-04 ENCOUNTER — Ambulatory Visit: Payer: Medicaid Other | Admitting: *Deleted

## 2021-06-04 ENCOUNTER — Ambulatory Visit: Payer: Medicaid Other | Attending: Obstetrics

## 2021-06-04 ENCOUNTER — Ambulatory Visit (HOSPITAL_BASED_OUTPATIENT_CLINIC_OR_DEPARTMENT_OTHER): Payer: Medicaid Other | Admitting: *Deleted

## 2021-06-04 VITALS — BP 130/84 | HR 94

## 2021-06-04 DIAGNOSIS — O365932 Maternal care for other known or suspected poor fetal growth, third trimester, fetus 2: Secondary | ICD-10-CM | POA: Insufficient documentation

## 2021-06-04 DIAGNOSIS — O99213 Obesity complicating pregnancy, third trimester: Secondary | ICD-10-CM | POA: Insufficient documentation

## 2021-06-04 DIAGNOSIS — O30043 Twin pregnancy, dichorionic/diamniotic, third trimester: Secondary | ICD-10-CM | POA: Diagnosis present

## 2021-06-04 DIAGNOSIS — O3090 Multiple gestation, unspecified, unspecified trimester: Secondary | ICD-10-CM

## 2021-06-04 DIAGNOSIS — Z3A31 31 weeks gestation of pregnancy: Secondary | ICD-10-CM

## 2021-06-04 DIAGNOSIS — O365931 Maternal care for other known or suspected poor fetal growth, third trimester, fetus 1: Secondary | ICD-10-CM | POA: Diagnosis present

## 2021-06-04 DIAGNOSIS — O365990 Maternal care for other known or suspected poor fetal growth, unspecified trimester, not applicable or unspecified: Secondary | ICD-10-CM

## 2021-06-04 DIAGNOSIS — E669 Obesity, unspecified: Secondary | ICD-10-CM

## 2021-06-04 NOTE — Procedures (Signed)
Crystal Cannon [birth year] [redacted]w[redacted]d   Fetus B Non-Stress Test Interpretation for 06/04/21  Indication:  Twins growth discordance  Fetal Heart Rate Fetus B Mode: External Baseline Rate (B): 130 BPM Variability: Moderate Accelerations: 15 x 15 Decelerations: None  Uterine Activity Mode: Palpation, Toco Contraction Frequency (min): none Resting Tone Palpated: Relaxed  Interpretation (Baby B - Fetal Testing) Nonstress Test Interpretation (Baby B): Reactive Overall Impression (Baby B): Reassuring for gestational age Comments (Baby B): Dr. Parke Poisson reviewed tracing  Crystal Cannon [birth year] [redacted]w[redacted]d  Fetus A Non-Stress Test Interpretation for 06/04/21  Indication:  Twins growth discordance  Fetal Heart Rate A Mode: External Baseline Rate (A): 135 bpm Variability: Moderate Accelerations: 15 x 15 Decelerations: None Multiple birth?: Yes  Uterine Activity Mode: Palpation, Toco Contraction Frequency (min): none Resting Tone Palpated: Relaxed  Interpretation (Fetal Testing) Nonstress Test Interpretation: Reactive Overall Impression: Reassuring for gestational age Comments: Dr. Parke Poisson reviewed tracing

## 2021-06-07 ENCOUNTER — Other Ambulatory Visit: Payer: Self-pay | Admitting: *Deleted

## 2021-06-07 DIAGNOSIS — O365932 Maternal care for other known or suspected poor fetal growth, third trimester, fetus 2: Secondary | ICD-10-CM

## 2021-06-09 ENCOUNTER — Ambulatory Visit (INDEPENDENT_AMBULATORY_CARE_PROVIDER_SITE_OTHER): Payer: Medicaid Other | Admitting: Obstetrics and Gynecology

## 2021-06-09 ENCOUNTER — Other Ambulatory Visit: Payer: Self-pay

## 2021-06-09 VITALS — BP 132/89 | HR 96 | Wt 271.0 lb

## 2021-06-09 DIAGNOSIS — Z2839 Other underimmunization status: Secondary | ICD-10-CM

## 2021-06-09 DIAGNOSIS — R8271 Bacteriuria: Secondary | ICD-10-CM

## 2021-06-09 DIAGNOSIS — O09899 Supervision of other high risk pregnancies, unspecified trimester: Secondary | ICD-10-CM

## 2021-06-09 DIAGNOSIS — O30043 Twin pregnancy, dichorionic/diamniotic, third trimester: Secondary | ICD-10-CM

## 2021-06-09 DIAGNOSIS — Z3A31 31 weeks gestation of pregnancy: Secondary | ICD-10-CM | POA: Insufficient documentation

## 2021-06-09 DIAGNOSIS — Z3480 Encounter for supervision of other normal pregnancy, unspecified trimester: Secondary | ICD-10-CM

## 2021-06-09 NOTE — Progress Notes (Signed)
MFM recommend delivery at 36 weeks, pt had u/s on 10/14.

## 2021-06-09 NOTE — Progress Notes (Signed)
   PRENATAL VISIT NOTE  Subjective:  Crystal Cannon is a 30 y.o. G4P1021 at [redacted]w[redacted]d being seen today for ongoing prenatal care.  She is currently monitored for the following issues for this high-risk pregnancy and has Supervision of other normal pregnancy, antepartum; Dichorionic diamniotic twin gestation; Rubella non-immune status, antepartum; GBS bacteriuria; and [redacted] weeks gestation of pregnancy on their problem list.  Patient doing well with no acute concerns today. She reports no complaints.  Contractions: Not present. Vag. Bleeding: None.  Movement: Present. Denies leaking of fluid.   The following portions of the patient's history were reviewed and updated as appropriate: allergies, current medications, past family history, past medical history, past social history, past surgical history and problem list. Problem list updated.  Objective:   Vitals:   06/09/21 1126  BP: 132/89  Pulse: 96  Weight: 271 lb (122.9 kg)    Fetal Status: Fetal Heart Rate (bpm): A:150 B:145 Fundal Height: 36 cm Movement: Present     General:  Alert, oriented and cooperative. Patient is in no acute distress.  Skin: Skin is warm and dry. No rash noted.   Cardiovascular: Normal heart rate noted  Respiratory: Normal respiratory effort, no problems with respiration noted  Abdomen: Soft, gravid, appropriate for gestational age.  Pain/Pressure: Present     Pelvic: Cervical exam deferred        Extremities: Normal range of motion.     Mental Status:  Normal mood and affect. Normal behavior. Normal judgment and thought content.   Assessment and Plan:  Pregnancy: G4P1021 at [redacted]w[redacted]d  1. [redacted] weeks gestation of pregnancy   2. Supervision of other normal pregnancy, antepartum Continue routine care  3. Rubella non-immune status, antepartum Treat after delivery  4. GBS bacteriuria Treat if pt is able to labor  5. Dichorionic diamniotic twin pregnancy in third trimester 35% discordance on last u/s, continue  weekly dopplers, consensus is c section with BTL unless there is position change.  Scheduled c/s for 36 weeks at next visit  Preterm labor symptoms and general obstetric precautions including but not limited to vaginal bleeding, contractions, leaking of fluid and fetal movement were reviewed in detail with the patient.  Please refer to After Visit Summary for other counseling recommendations.   Return in about 2 weeks (around 06/23/2021) for [facility], in person.   [name], MD Faculty Attending [facility]

## 2021-06-11 ENCOUNTER — Ambulatory Visit (HOSPITAL_BASED_OUTPATIENT_CLINIC_OR_DEPARTMENT_OTHER): Payer: Medicaid Other | Admitting: *Deleted

## 2021-06-11 ENCOUNTER — Ambulatory Visit: Payer: Medicaid Other | Attending: Obstetrics

## 2021-06-11 ENCOUNTER — Other Ambulatory Visit: Payer: Self-pay

## 2021-06-11 ENCOUNTER — Ambulatory Visit: Payer: Medicaid Other | Admitting: *Deleted

## 2021-06-11 VITALS — BP 131/87 | HR 85

## 2021-06-11 DIAGNOSIS — O99213 Obesity complicating pregnancy, third trimester: Secondary | ICD-10-CM | POA: Diagnosis not present

## 2021-06-11 DIAGNOSIS — O30043 Twin pregnancy, dichorionic/diamniotic, third trimester: Secondary | ICD-10-CM

## 2021-06-11 DIAGNOSIS — Z3A32 32 weeks gestation of pregnancy: Secondary | ICD-10-CM | POA: Diagnosis not present

## 2021-06-11 DIAGNOSIS — E669 Obesity, unspecified: Secondary | ICD-10-CM

## 2021-06-11 DIAGNOSIS — O365932 Maternal care for other known or suspected poor fetal growth, third trimester, fetus 2: Secondary | ICD-10-CM

## 2021-06-11 NOTE — Procedures (Signed)
Maryori Matty 01/14/1991 [redacted]w[redacted]d   Fetus B Non-Stress Test Interpretation for 06/11/21  Indication: IUGR  Fetal Heart Rate Fetus B Mode: External Baseline Rate (B): 125 BPM Variability: Moderate Accelerations: 15 x 15 Decelerations: None  Uterine Activity Mode: Palpation, Toco Contraction Frequency (min): none Resting Tone Palpated: Relaxed  Interpretation (Baby B - Fetal Testing) Nonstress Test Interpretation (Baby B): Reactive Overall Impression (Baby B): Reassuring for gestational age Comments (Baby B): Dr. Grace Bushy reviewed tracing  Eufelia Hofstra 1990-11-21 [redacted]w[redacted]d  Fetus A Non-Stress Test Interpretation for 06/11/21  Indication:  di di twins  Fetal Heart Rate A Mode: External Baseline Rate (A): 120 bpm Variability: Moderate Accelerations: 15 x 15 Decelerations: None Multiple birth?: Yes  Uterine Activity Mode: Palpation, Toco Contraction Frequency (min): none Resting Tone Palpated: Relaxed  Interpretation (Fetal Testing) Nonstress Test Interpretation: Reactive Overall Impression: Reassuring for gestational age Comments: Dr. Grace Bushy reviewed tracing.

## 2021-06-14 ENCOUNTER — Other Ambulatory Visit: Payer: Self-pay | Admitting: Obstetrics

## 2021-06-14 DIAGNOSIS — O30043 Twin pregnancy, dichorionic/diamniotic, third trimester: Secondary | ICD-10-CM

## 2021-06-15 ENCOUNTER — Inpatient Hospital Stay (EMERGENCY_DEPARTMENT_HOSPITAL)
Admission: AD | Admit: 2021-06-15 | Discharge: 2021-06-15 | Disposition: A | Discharge status: Home / Self Care | Payer: Medicaid Other | Source: Home / Self Care | Attending: Obstetrics & Gynecology | Admitting: Obstetrics & Gynecology

## 2021-06-15 ENCOUNTER — Encounter (HOSPITAL_COMMUNITY)
Admission: AD | Disposition: A | Discharge status: Home / Self Care | Payer: Self-pay | Source: Home / Self Care | Attending: Obstetrics and Gynecology

## 2021-06-15 ENCOUNTER — Inpatient Hospital Stay (HOSPITAL_COMMUNITY)
Admission: AD | Admit: 2021-06-15 | Discharge: 2021-06-18 | DRG: 788 | Disposition: A | Discharge status: Home / Self Care | Payer: Medicaid Other | Attending: Obstetrics and Gynecology | Admitting: Obstetrics and Gynecology

## 2021-06-15 ENCOUNTER — Encounter (HOSPITAL_COMMUNITY): Payer: Self-pay | Admitting: Obstetrics and Gynecology

## 2021-06-15 ENCOUNTER — Other Ambulatory Visit: Payer: Self-pay

## 2021-06-15 ENCOUNTER — Telehealth: Payer: Self-pay

## 2021-06-15 ENCOUNTER — Encounter (HOSPITAL_COMMUNITY): Payer: Self-pay | Admitting: Obstetrics & Gynecology

## 2021-06-15 DIAGNOSIS — O365931 Maternal care for other known or suspected poor fetal growth, third trimester, fetus 1: Secondary | ICD-10-CM | POA: Diagnosis present

## 2021-06-15 DIAGNOSIS — Z2839 Other underimmunization status: Secondary | ICD-10-CM

## 2021-06-15 DIAGNOSIS — O321XX1 Maternal care for breech presentation, fetus 1: Secondary | ICD-10-CM | POA: Diagnosis present

## 2021-06-15 DIAGNOSIS — O26893 Other specified pregnancy related conditions, third trimester: Secondary | ICD-10-CM | POA: Insufficient documentation

## 2021-06-15 DIAGNOSIS — Z7982 Long term (current) use of aspirin: Secondary | ICD-10-CM

## 2021-06-15 DIAGNOSIS — Z3480 Encounter for supervision of other normal pregnancy, unspecified trimester: Secondary | ICD-10-CM

## 2021-06-15 DIAGNOSIS — Z87891 Personal history of nicotine dependence: Secondary | ICD-10-CM | POA: Insufficient documentation

## 2021-06-15 DIAGNOSIS — O99891 Other specified diseases and conditions complicating pregnancy: Secondary | ICD-10-CM

## 2021-06-15 DIAGNOSIS — Z3A32 32 weeks gestation of pregnancy: Secondary | ICD-10-CM

## 2021-06-15 DIAGNOSIS — O99214 Obesity complicating childbirth: Secondary | ICD-10-CM | POA: Diagnosis present

## 2021-06-15 DIAGNOSIS — B9689 Other specified bacterial agents as the cause of diseases classified elsewhere: Secondary | ICD-10-CM | POA: Insufficient documentation

## 2021-06-15 DIAGNOSIS — Z20822 Contact with and (suspected) exposure to covid-19: Secondary | ICD-10-CM | POA: Diagnosis present

## 2021-06-15 DIAGNOSIS — O169 Unspecified maternal hypertension, unspecified trimester: Secondary | ICD-10-CM

## 2021-06-15 DIAGNOSIS — O30043 Twin pregnancy, dichorionic/diamniotic, third trimester: Secondary | ICD-10-CM | POA: Diagnosis present

## 2021-06-15 DIAGNOSIS — O42013 Preterm premature rupture of membranes, onset of labor within 24 hours of rupture, third trimester: Secondary | ICD-10-CM

## 2021-06-15 DIAGNOSIS — N760 Acute vaginitis: Secondary | ICD-10-CM

## 2021-06-15 DIAGNOSIS — O134 Gestational [pregnancy-induced] hypertension without significant proteinuria, complicating childbirth: Secondary | ICD-10-CM | POA: Diagnosis present

## 2021-06-15 DIAGNOSIS — O321XX2 Maternal care for breech presentation, fetus 2: Secondary | ICD-10-CM | POA: Diagnosis present

## 2021-06-15 DIAGNOSIS — R8271 Bacteriuria: Secondary | ICD-10-CM | POA: Diagnosis present

## 2021-06-15 DIAGNOSIS — O30049 Twin pregnancy, dichorionic/diamniotic, unspecified trimester: Secondary | ICD-10-CM | POA: Diagnosis present

## 2021-06-15 DIAGNOSIS — O09899 Supervision of other high risk pregnancies, unspecified trimester: Secondary | ICD-10-CM

## 2021-06-15 DIAGNOSIS — O139 Gestational [pregnancy-induced] hypertension without significant proteinuria, unspecified trimester: Secondary | ICD-10-CM | POA: Diagnosis present

## 2021-06-15 LAB — URINALYSIS, ROUTINE W REFLEX MICROSCOPIC
Bilirubin Urine: NEGATIVE
Glucose, UA: NEGATIVE mg/dL
Ketones, ur: NEGATIVE mg/dL
Nitrite: NEGATIVE
Protein, ur: 30 mg/dL — AB
Specific Gravity, Urine: 1.015 (ref 1.005–1.030)
pH: 6 (ref 5.0–8.0)

## 2021-06-15 LAB — CBC
HCT: 32.9 % — ABNORMAL LOW (ref 36.0–46.0)
Hemoglobin: 10.9 g/dL — ABNORMAL LOW (ref 12.0–15.0)
MCH: 28.9 pg (ref 26.0–34.0)
MCHC: 33.1 g/dL (ref 30.0–36.0)
MCV: 87.3 fL (ref 80.0–100.0)
Platelets: 231 10*3/uL (ref 150–400)
RBC: 3.77 MIL/uL — ABNORMAL LOW (ref 3.87–5.11)
RDW: 12.3 % (ref 11.5–15.5)
WBC: 11.7 10*3/uL — ABNORMAL HIGH (ref 4.0–10.5)
nRBC: 0 % (ref 0.0–0.2)

## 2021-06-15 LAB — COMPREHENSIVE METABOLIC PANEL
ALT: 10 U/L (ref 0–44)
AST: 12 U/L — ABNORMAL LOW (ref 15–41)
Albumin: 2.7 g/dL — ABNORMAL LOW (ref 3.5–5.0)
Alkaline Phosphatase: 122 U/L (ref 38–126)
Anion gap: 8 (ref 5–15)
BUN: 5 mg/dL — ABNORMAL LOW (ref 6–20)
CO2: 24 mmol/L (ref 22–32)
Calcium: 8.6 mg/dL — ABNORMAL LOW (ref 8.9–10.3)
Chloride: 102 mmol/L (ref 98–111)
Creatinine, Ser: 0.55 mg/dL (ref 0.44–1.00)
GFR, Estimated: >60 mL/min (ref >60)
Glucose, Bld: 102 mg/dL — ABNORMAL HIGH (ref 70–99)
Potassium: 3.9 mmol/L (ref 3.5–5.1)
Sodium: 134 mmol/L — ABNORMAL LOW (ref 135–145)
Total Bilirubin: 0.4 mg/dL (ref 0.3–1.2)
Total Protein: 6 g/dL — ABNORMAL LOW (ref 6.5–8.1)

## 2021-06-15 LAB — POCT FERN TEST: POCT Fern Test: NEGATIVE

## 2021-06-15 LAB — PROTEIN / CREATININE RATIO, URINE
Creatinine, Urine: 155.76 mg/dL
Protein Creatinine Ratio: 0.19 mg/mg{Cre} — ABNORMAL HIGH (ref 0.00–0.15)
Total Protein, Urine: 29 mg/dL

## 2021-06-15 LAB — WET PREP, GENITAL
Sperm: NONE SEEN
Trich, Wet Prep: NONE SEEN
Yeast Wet Prep HPF POC: NONE SEEN

## 2021-06-15 LAB — RESP PANEL BY RT-PCR (FLU A&B, COVID) ARPGX2
Influenza A by PCR: NEGATIVE
Influenza B by PCR: NEGATIVE
SARS Coronavirus 2 by RT PCR: NEGATIVE

## 2021-06-15 LAB — AMNISURE RUPTURE OF MEMBRANE (ROM) NOT AT ARMC: Amnisure ROM: NEGATIVE

## 2021-06-15 SURGERY — Surgical Case
Anesthesia: Spinal

## 2021-06-15 MED ORDER — BETAMETHASONE SOD PHOS & ACET 6 (3-3) MG/ML IJ SUSP
12.0000 mg | Freq: Once | INTRAMUSCULAR | Status: AC
  Administered 2021-06-15: 12 mg via INTRAMUSCULAR
  Filled 2021-06-15: qty 5

## 2021-06-15 MED ORDER — FENTANYL CITRATE (PF) 100 MCG/2ML IJ SOLN
INTRAMUSCULAR | Status: AC
  Filled 2021-06-15: qty 2

## 2021-06-15 MED ORDER — FAMOTIDINE IN NACL 20-0.9 MG/50ML-% IV SOLN
20.0000 mg | Freq: Once | INTRAVENOUS | Status: AC
  Administered 2021-06-15: 20 mg via INTRAVENOUS
  Filled 2021-06-15: qty 50

## 2021-06-15 MED ORDER — OXYTOCIN-SODIUM CHLORIDE 30-0.9 UT/500ML-% IV SOLN
INTRAVENOUS | Status: AC
  Filled 2021-06-15: qty 500

## 2021-06-15 MED ORDER — SOD CITRATE-CITRIC ACID 500-334 MG/5ML PO SOLN
30.0000 mL | Freq: Once | ORAL | Status: AC
  Administered 2021-06-15: 30 mL via ORAL
  Filled 2021-06-15: qty 30

## 2021-06-15 MED ORDER — TERBUTALINE SULFATE 1 MG/ML IJ SOLN
0.2500 mg | Freq: Once | INTRAMUSCULAR | Status: AC
  Administered 2021-06-15: 0.25 mg via SUBCUTANEOUS
  Filled 2021-06-15: qty 1

## 2021-06-15 MED ORDER — DEXAMETHASONE SODIUM PHOSPHATE 4 MG/ML IJ SOLN
INTRAMUSCULAR | Status: AC
  Filled 2021-06-15: qty 1

## 2021-06-15 MED ORDER — METRONIDAZOLE 500 MG PO TABS: 500.0000 mg | ORAL_TABLET | Freq: Two times a day (BID) | ORAL | 0 refills | Status: DC

## 2021-06-15 MED ORDER — LACTATED RINGERS IV SOLN: INTRAVENOUS | Status: DC

## 2021-06-15 MED ORDER — SOD CITRATE-CITRIC ACID 500-334 MG/5ML PO SOLN: 30.0000 mL | Freq: Once | ORAL | Status: DC

## 2021-06-15 MED ORDER — CEFAZOLIN SODIUM-DEXTROSE 2-4 GM/100ML-% IV SOLN: 2.0000 g | INTRAVENOUS | Status: DC

## 2021-06-15 MED ORDER — PHENYLEPHRINE HCL-NACL 20-0.9 MG/250ML-% IV SOLN
INTRAVENOUS | Status: AC
  Filled 2021-06-15: qty 250

## 2021-06-15 MED ORDER — MORPHINE SULFATE (PF) 0.5 MG/ML IJ SOLN
INTRAMUSCULAR | Status: AC
  Filled 2021-06-15: qty 10

## 2021-06-15 MED ORDER — ONDANSETRON HCL 4 MG/2ML IJ SOLN
INTRAMUSCULAR | Status: AC
  Filled 2021-06-15: qty 2

## 2021-06-15 SURGICAL SUPPLY — 28 items
BENZOIN TINCTURE PRP APPL 2/3 (GAUZE/BANDAGES/DRESSINGS) ×2 IMPLANT
CHLORAPREP W/TINT 26ML (MISCELLANEOUS) ×2 IMPLANT
CLAMP CORD UMBIL (MISCELLANEOUS) IMPLANT
CLOSURE STERI STRIP 1/2 X4 (GAUZE/BANDAGES/DRESSINGS) ×2 IMPLANT
DRSG OPSITE POSTOP 4X10 (GAUZE/BANDAGES/DRESSINGS) ×2 IMPLANT
ELECT REM PT RETURN 9FT ADLT (ELECTROSURGICAL) ×2 IMPLANT
ELECTRODE REM PT RTRN 9FT ADLT (ELECTROSURGICAL) ×1 IMPLANT
EXTRACTOR VACUUM M CUP 4 TUBE (SUCTIONS) IMPLANT
GLOVE BIOGEL PI IND STRL 6.5 (GLOVE) ×1 IMPLANT
GLOVE BIOGEL PI IND STRL 7.0 (GLOVE) ×1 IMPLANT
GLOVE BIOGEL PI INDICATOR 6.5 (GLOVE) ×1
GLOVE BIOGEL PI INDICATOR 7.0 (GLOVE) ×1
GLOVE SURG SS PI 6.5 STRL IVOR (GLOVE) ×2 IMPLANT
GOWN STRL REUS W/TWL LRG LVL3 (GOWN DISPOSABLE) ×4 IMPLANT
KIT ABG SYR 3ML LUER SLIP (SYRINGE) IMPLANT
NEEDLE HYPO 25X5/8 SAFETYGLIDE (NEEDLE) IMPLANT
NS IRRIG 1000ML POUR BTL (IV SOLUTION) ×2 IMPLANT
PACK C SECTION WH (CUSTOM PROCEDURE TRAY) ×2 IMPLANT
PAD OB MATERNITY 4.3X12.25 (PERSONAL CARE ITEMS) ×2 IMPLANT
PENCIL SMOKE EVAC W/HOLSTER (ELECTROSURGICAL) ×2 IMPLANT
RTRCTR C-SECT PINK 25CM LRG (MISCELLANEOUS) IMPLANT
SUT PLAIN 0 NONE (SUTURE) IMPLANT
SUT PLAIN 2 0 XLH (SUTURE) ×2 IMPLANT
SUT VIC AB 0 CT1 36 (SUTURE) ×8 IMPLANT
SUT VIC AB 4-0 KS 27 (SUTURE) ×2 IMPLANT
TOWEL OR 17X24 6PK STRL BLUE (TOWEL DISPOSABLE) ×2 IMPLANT
TRAY FOLEY W/BAG SLVR 14FR LF (SET/KITS/TRAYS/PACK) ×2 IMPLANT
WATER STERILE IRR 1000ML POUR (IV SOLUTION) ×4 IMPLANT

## 2021-06-15 NOTE — MAU Note (Signed)
NICU notified of pt's admission and status.  

## 2021-06-15 NOTE — H&P (Signed)
Chief Complaint:  Contractions   Event Date/Time   First Provider Initiated Contact with Patient 06/15/21 2251     HPI: Crystal Cannon is a 30 y.o. G4P1021 at 29w5dho presents to maternity admissions reporting painful contractions since leaving her earlier today. Was here about noon for possible ruptured membranes.  Exam was negative for that.  Also had newly elevated BPs and labs were done (normal ).  States when she got home this afternoon the contractions got more frequent and painful.  Had some bloody show. . She reports good fetal movement, denies LOF, urinary symptoms, h/a, dizziness, n/v, diarrhea, constipation or fever/chills.  She denies headache, visual changes or RUQ abdominal pain.  Abdominal Pain This is a recurrent problem. The current episode started today. The problem occurs intermittently. The problem has been gradually worsening. The pain is moderate. The quality of the pain is cramping. The abdominal pain does not radiate. Pertinent negatives include no constipation, diarrhea, dysuria, fever, frequency, nausea or vomiting. Nothing aggravates the pain. The pain is relieved by Nothing. She has tried nothing for the symptoms.   RN Note: Pt was seen here earlier today for poss PPROM and ctxs. Amnisure negative. Returns tonight with stronger ctxs and bloody show   Past Medical History: No past medical history on file.  Past obstetric history: OB History  Gravida Para Term Preterm AB Living  4 1 1   2 1   SAB IAB Ectopic Multiple Live Births  1 1     1     # Outcome Date GA Lbr Len/2nd Weight Sex Delivery Anes PTL Lv  4 Current           3 IAB 2019 729w0d       2 Term 05/23/14 3973w0d:00 / 00:51 4111 g F Vag-Spont EPI  LIV  1 SAB 2012            Past Surgical History: History reviewed. No pertinent surgical history.  Family History: History reviewed. No pertinent family history.  Social History: Social History   Tobacco Use   Smoking status: Former     Packs/day: 0.25    Types: Cigarettes    Quit date: 2015    Years since quitting: 7.8   Smokeless tobacco: Never  Vaping Use   Vaping Use: Never used  Substance Use Topics   Alcohol use: No   Drug use: No    Allergies: No Known Allergies  Meds:  Medications Prior to Admission  Medication Sig Dispense Refill Last Dose   aspirin EC 81 MG tablet Take 1 tablet (81 mg total) by mouth daily. Swallow whole. 30 tablet 11    Blood Pressure Monitoring (BLOOD PRESSURE KIT) DEVI 1 kit by Does not apply route as needed. Large Cuff 1 each 0    metroNIDAZOLE (FLAGYL) 500 MG tablet Take 1 tablet (500 mg total) by mouth 2 (two) times daily. 14 tablet 0    Prenatal Vit-Fe Fumarate-FA (PRENATAL MULTIVITAMIN) TABS tablet Take 1 tablet by mouth daily at 12 noon.       I have reviewed patient's Past Medical Hx, Surgical Hx, Family Hx, Social Hx, medications and allergies.   ROS:  Review of Systems  Constitutional:  Negative for fever.  Gastrointestinal:  Positive for abdominal pain. Negative for constipation, diarrhea, nausea and vomiting.  Genitourinary:  Negative for dysuria and frequency.  Other systems negative  Physical Exam  Patient Vitals for the past 24 hrs:  BP Pulse Resp SpO2  06/15/21 2246 (!)Marland Kitchen  139/94 90 -- 100 %  06/15/21 2244 -- -- 18 --     Constitutional: Well-developed, well-nourished female in no acute distress.  Cardiovascular: normal rate and rhythm Respiratory: normal effort, clear to auscultation bilaterally GI: Abd soft, non-tender, gravid appropriate for gestational age.   No rebound or guarding. MS: Extremities nontender, no edema, normal ROM Neurologic: Alert and oriented x 4.  GU: Neg CVAT.  PELVIC EXAM:  Dilation: 5.5 Effacement (%): 90 Presentation:  (Breech, Breech by BSUS) Exam by:: Hansel Feinstein CNM Bulging membranes Light bloody mucous  FHT:  Baseline 140/140 , moderate variability, accelerations present, no decelerations Contractions: q 2 mins     Labs: Results for orders placed or performed during the hospital encounter of 06/15/21 (from the past 24 hour(s))  Urinalysis, Routine w reflex microscopic Urine, Clean Catch     Status: Abnormal   Collection Time: 06/15/21 10:38 AM  Result Value Ref Range   Color, Urine YELLOW YELLOW   APPearance HAZY (A) CLEAR   Specific Gravity, Urine 1.015 1.005 - 1.030   pH 6.0 5.0 - 8.0   Glucose, UA NEGATIVE NEGATIVE mg/dL   Hgb urine dipstick SMALL (A) NEGATIVE   Bilirubin Urine NEGATIVE NEGATIVE   Ketones, ur NEGATIVE NEGATIVE mg/dL   Protein, ur 30 (A) NEGATIVE mg/dL   Nitrite NEGATIVE NEGATIVE   Leukocytes,Ua MODERATE (A) NEGATIVE   RBC / HPF 0-5 0 - 5 RBC/hpf   WBC, UA 11-20 0 - 5 WBC/hpf   Bacteria, UA RARE (A) NONE SEEN   Squamous Epithelial / LPF 6-10 0 - 5   Mucus PRESENT   Fern Test     Status: None   Collection Time: 06/15/21 11:47 AM  Result Value Ref Range   POCT Fern Test Negative = intact amniotic membranes   Amnisure rupture of membrane (rom)not at [facility]     Status: None   Collection Time: 06/15/21 11:47 AM  Result Value Ref Range   Amnisure ROM NEGATIVE   Wet prep, genital     Status: Abnormal   Collection Time: 06/15/21 12:00 PM  Result Value Ref Range   Yeast Wet Prep HPF POC NONE SEEN NONE SEEN   Trich, Wet Prep NONE SEEN NONE SEEN   Clue Cells Wet Prep HPF POC PRESENT (A) NONE SEEN   WBC, Wet Prep HPF POC MANY (A) NONE SEEN   Sperm NONE SEEN   Protein / creatinine ratio, urine     Status: Abnormal   Collection Time: 06/15/21 12:22 PM  Result Value Ref Range   Creatinine, Urine 155.76 mg/dL   Total Protein, Urine 29 mg/dL   Protein Creatinine Ratio 0.19 (H) 0.00 - 0.15 mg/mg[Cre]  CBC     Status: Abnormal   Collection Time: 06/15/21 12:49 PM  Result Value Ref Range   WBC 11.7 (H) 4.0 - 10.5 K/uL   RBC 3.77 (L) 3.87 - 5.11 MIL/uL   Hemoglobin 10.9 (L) 12.0 - 15.0 g/dL   HCT 32.9 (L) 36.0 - 46.0 %   MCV 87.3 80.0 - 100.0 fL   MCH 28.9 26.0 - 34.0 pg   MCHC  33.1 30.0 - 36.0 g/dL   RDW 12.3 11.5 - 15.5 %   Platelets 231 150 - 400 K/uL   nRBC 0.0 0.0 - 0.2 %  Comprehensive metabolic panel     Status: Abnormal   Collection Time: 06/15/21 12:49 PM  Result Value Ref Range   Sodium 134 (L) 135 - 145 mmol/L   Potassium 3.9 3.5 -  5.1 mmol/L   Chloride 102 98 - 111 mmol/L   CO2 24 22 - 32 mmol/L   Glucose, Bld 102 (H) 70 - 99 mg/dL   BUN 5 (L) 6 - 20 mg/dL   Creatinine, Ser 0.55 0.44 - 1.00 mg/dL   Calcium 8.6 (L) 8.9 - 10.3 mg/dL   Total Protein 6.0 (L) 6.5 - 8.1 g/dL   Albumin 2.7 (L) 3.5 - 5.0 g/dL   AST 12 (L) 15 - 41 U/L   ALT 10 0 - 44 U/L   Alkaline Phosphatase 122 38 - 126 U/L   Total Bilirubin 0.4 0.3 - 1.2 mg/dL   GFR, Estimated >60 >60 mL/min   Anion gap 8 5 - 15   --/--/O POS (07/14 1506)  Imaging:  Pt informed that the ultrasound is considered a limited OB ultrasound and is not intended to be a complete ultrasound exam.  Patient also informed that the ultrasound is not being completed with the intent of assessing for fetal or placental anomalies or any pelvic abnormalities.  Explained that the purpose of today's ultrasound is to assess for presentation..  Patient acknowledges the purpose of the exam and the limitations of the study  Both twins are breech.  One head to maternal right, one head in fundus.  Per prior report, Twin A placenta is posteriori, Twin B placenta is anterior .    MAU Course/MDM: I have ordered labs and reviewed results.  NST reviewed, reassuring both twins Consult Dr Elly Modena with presentation, exam findings and test results. She will come down.  Treatments in MAU included EFM, IV, Betamethasone..    Assessment: Diamniotic, Diachorionic twin gestation at 56w5dPreterm labor Breech/Breech presentation Gestational Hypertension without evidence of preeclampsia  Fetal discordance with FGR Twin A  Plan: Admit for Cesarean Delivery Routine preoperative orders Betamethasone given Dr CElly Modenain room  with patient MD to follow  MHansel FeinsteinCNM, MSN Certified Nurse-Midwife 06/15/2021 10:52 PM

## 2021-06-15 NOTE — MAU Provider Note (Signed)
Chief Complaint:  Contractions   Event Date/Time   First Provider Initiated Contact with Patient 06/15/21 2251     HPI: Crystal Cannon is a 30 y.o. G4P1021 at 55w5dho presents to maternity admissions reporting painful contractions since leaving her earlier today. Was here about noon for possible ruptured membranes.  Exam was negative for that.  Also had newly elevated BPs and labs were done (normal ).  States when she got home this afternoon the contractions got more frequent and painful.  Had some bloody show. . She reports good fetal movement, denies LOF, urinary symptoms, h/a, dizziness, n/v, diarrhea, constipation or fever/chills.  She denies headache, visual changes or RUQ abdominal pain.  Abdominal Pain This is a recurrent problem. The current episode started today. The problem occurs intermittently. The problem has been gradually worsening. The pain is moderate. The quality of the pain is cramping. The abdominal pain does not radiate. Pertinent negatives include no constipation, diarrhea, dysuria, fever, frequency, nausea or vomiting. Nothing aggravates the pain. The pain is relieved by Nothing. She has tried nothing for the symptoms.   RN Note: Pt was seen here earlier today for poss PPROM and ctxs. Amnisure negative. Returns tonight with stronger ctxs and bloody show   Past Medical History: No past medical history on file.  Past obstetric history: OB History  Gravida Para Term Preterm AB Living  4 1 1   2 1   SAB IAB Ectopic Multiple Live Births  1 1     1     # Outcome Date GA Lbr Len/2nd Weight Sex Delivery Anes PTL Lv  4 Current           3 IAB 2019 750w0d       2 Term 05/23/14 3912w0d:00 / 00:51 4111 g F Vag-Spont EPI  LIV  1 SAB 2012            Past Surgical History: History reviewed. No pertinent surgical history.  Family History: History reviewed. No pertinent family history.  Social History: Social History   Tobacco Use   Smoking status: Former     Packs/day: 0.25    Types: Cigarettes    Quit date: 2015    Years since quitting: 7.8   Smokeless tobacco: Never  Vaping Use   Vaping Use: Never used  Substance Use Topics   Alcohol use: No   Drug use: No    Allergies: No Known Allergies  Meds:  Medications Prior to Admission  Medication Sig Dispense Refill Last Dose   aspirin EC 81 MG tablet Take 1 tablet (81 mg total) by mouth daily. Swallow whole. 30 tablet 11    Blood Pressure Monitoring (BLOOD PRESSURE KIT) DEVI 1 kit by Does not apply route as needed. Large Cuff 1 each 0    metroNIDAZOLE (FLAGYL) 500 MG tablet Take 1 tablet (500 mg total) by mouth 2 (two) times daily. 14 tablet 0    Prenatal Vit-Fe Fumarate-FA (PRENATAL MULTIVITAMIN) TABS tablet Take 1 tablet by mouth daily at 12 noon.       I have reviewed patient's Past Medical Hx, Surgical Hx, Family Hx, Social Hx, medications and allergies.   ROS:  Review of Systems  Constitutional:  Negative for fever.  Gastrointestinal:  Positive for abdominal pain. Negative for constipation, diarrhea, nausea and vomiting.  Genitourinary:  Negative for dysuria and frequency.  Other systems negative  Physical Exam  Patient Vitals for the past 24 hrs:  BP Pulse Resp SpO2  06/15/21 2246 (!)Marland Kitchen  139/94 90 -- 100 %  06/15/21 2244 -- -- 18 --     Constitutional: Well-developed, well-nourished female in no acute distress.  Cardiovascular: normal rate and rhythm Respiratory: normal effort, clear to auscultation bilaterally GI: Abd soft, non-tender, gravid appropriate for gestational age.   No rebound or guarding. MS: Extremities nontender, no edema, normal ROM Neurologic: Alert and oriented x 4.  GU: Neg CVAT.  PELVIC EXAM:  Dilation: 5.5 Effacement (%): 90 Presentation:  (Breech, Breech by BSUS) Exam by:: Hansel Feinstein CNM Bulging membranes Light bloody mucous  FHT:  Baseline 140/140 , moderate variability, accelerations present, no decelerations Contractions: q 2 mins     Labs: Results for orders placed or performed during the hospital encounter of 06/15/21 (from the past 24 hour(s))  Urinalysis, Routine w reflex microscopic Urine, Clean Catch     Status: Abnormal   Collection Time: 06/15/21 10:38 AM  Result Value Ref Range   Color, Urine YELLOW YELLOW   APPearance HAZY (A) CLEAR   Specific Gravity, Urine 1.015 1.005 - 1.030   pH 6.0 5.0 - 8.0   Glucose, UA NEGATIVE NEGATIVE mg/dL   Hgb urine dipstick SMALL (A) NEGATIVE   Bilirubin Urine NEGATIVE NEGATIVE   Ketones, ur NEGATIVE NEGATIVE mg/dL   Protein, ur 30 (A) NEGATIVE mg/dL   Nitrite NEGATIVE NEGATIVE   Leukocytes,Ua MODERATE (A) NEGATIVE   RBC / HPF 0-5 0 - 5 RBC/hpf   WBC, UA 11-20 0 - 5 WBC/hpf   Bacteria, UA RARE (A) NONE SEEN   Squamous Epithelial / LPF 6-10 0 - 5   Mucus PRESENT   Fern Test     Status: None   Collection Time: 06/15/21 11:47 AM  Result Value Ref Range   POCT Fern Test Negative = intact amniotic membranes   Amnisure rupture of membrane (rom)not at [facility]     Status: None   Collection Time: 06/15/21 11:47 AM  Result Value Ref Range   Amnisure ROM NEGATIVE   Wet prep, genital     Status: Abnormal   Collection Time: 06/15/21 12:00 PM  Result Value Ref Range   Yeast Wet Prep HPF POC NONE SEEN NONE SEEN   Trich, Wet Prep NONE SEEN NONE SEEN   Clue Cells Wet Prep HPF POC PRESENT (A) NONE SEEN   WBC, Wet Prep HPF POC MANY (A) NONE SEEN   Sperm NONE SEEN   Protein / creatinine ratio, urine     Status: Abnormal   Collection Time: 06/15/21 12:22 PM  Result Value Ref Range   Creatinine, Urine 155.76 mg/dL   Total Protein, Urine 29 mg/dL   Protein Creatinine Ratio 0.19 (H) 0.00 - 0.15 mg/mg[Cre]  CBC     Status: Abnormal   Collection Time: 06/15/21 12:49 PM  Result Value Ref Range   WBC 11.7 (H) 4.0 - 10.5 K/uL   RBC 3.77 (L) 3.87 - 5.11 MIL/uL   Hemoglobin 10.9 (L) 12.0 - 15.0 g/dL   HCT 32.9 (L) 36.0 - 46.0 %   MCV 87.3 80.0 - 100.0 fL   MCH 28.9 26.0 - 34.0 pg   MCHC  33.1 30.0 - 36.0 g/dL   RDW 12.3 11.5 - 15.5 %   Platelets 231 150 - 400 K/uL   nRBC 0.0 0.0 - 0.2 %  Comprehensive metabolic panel     Status: Abnormal   Collection Time: 06/15/21 12:49 PM  Result Value Ref Range   Sodium 134 (L) 135 - 145 mmol/L   Potassium 3.9 3.5 -  5.1 mmol/L   Chloride 102 98 - 111 mmol/L   CO2 24 22 - 32 mmol/L   Glucose, Bld 102 (H) 70 - 99 mg/dL   BUN 5 (L) 6 - 20 mg/dL   Creatinine, Ser 0.55 0.44 - 1.00 mg/dL   Calcium 8.6 (L) 8.9 - 10.3 mg/dL   Total Protein 6.0 (L) 6.5 - 8.1 g/dL   Albumin 2.7 (L) 3.5 - 5.0 g/dL   AST 12 (L) 15 - 41 U/L   ALT 10 0 - 44 U/L   Alkaline Phosphatase 122 38 - 126 U/L   Total Bilirubin 0.4 0.3 - 1.2 mg/dL   GFR, Estimated >60 >60 mL/min   Anion gap 8 5 - 15   --/--/O POS (07/14 1506)  Imaging:  Pt informed that the ultrasound is considered a limited OB ultrasound and is not intended to be a complete ultrasound exam.  Patient also informed that the ultrasound is not being completed with the intent of assessing for fetal or placental anomalies or any pelvic abnormalities.  Explained that the purpose of today's ultrasound is to assess for presentation..  Patient acknowledges the purpose of the exam and the limitations of the study  Both twins are breech.  One head to maternal right, one head in fundus.  Per prior report, Twin A placenta is posteriori, Twin B placenta is anterior .    MAU Course/MDM: I have ordered labs and reviewed results.  NST reviewed, reassuring both twins Consult Dr Elly Modena with presentation, exam findings and test results. She will come down.  Treatments in MAU included EFM, IV, Betamethasone..    Assessment: Diamniotic, Diachorionic twin gestation at 3w5dPreterm labor Breech/Breech presentation Gestational Hypertension without evidence of preeclampsia  Fetal discordance with FGR Twin A  Plan: Admit for Cesarean Delivery Routine preoperative orders Betamethasone given Dr CElly Modenain room  with patient MD to follow  MHansel FeinsteinCNM, MSN Certified Nurse-Midwife 06/15/2021 10:52 PM

## 2021-06-15 NOTE — Anesthesia Preprocedure Evaluation (Addendum)
Anesthesia Evaluation  Patient identified by MRN, date of birth, ID band Patient awake    Reviewed: Allergy & Precautions, NPO status , Patient's Chart, lab work & pertinent test results  Airway Mallampati: III  TM Distance: >3 FB Neck ROM: Full    Dental no notable dental hx. (+) Teeth Intact, Dental Advisory Given   Pulmonary former smoker,  Uses inhaler every few months    Pulmonary exam normal breath sounds clear to auscultation       Cardiovascular negative cardio ROS Normal cardiovascular exam Rhythm:Regular Rate:Normal     Neuro/Psych negative neurological ROS  negative psych ROS   GI/Hepatic negative GI ROS, Neg liver ROS,   Endo/Other  Morbid obesityBMI 42  Renal/GU negative Renal ROS  negative genitourinary   Musculoskeletal negative musculoskeletal ROS (+)   Abdominal (+) + obese,   Peds negative pediatric ROS (+)  Hematology  (+) Blood dyscrasia, anemia , hct 32.9, plt 231   Anesthesia Other Findings   Reproductive/Obstetrics (+) Pregnancy Di/di twins breach active labor in MAU, one prior vaginal delivery 2015                            Anesthesia Physical Anesthesia Plan  ASA: 3  Anesthesia Plan: Spinal   Post-op Pain Management:    Induction:   PONV Risk Score and Plan: 3 and Ondansetron, Dexamethasone and Treatment may vary due to age or medical condition  Airway Management Planned: Natural Airway and Nasal Cannula  Additional Equipment: None  Intra-op Plan:   Post-operative Plan:   Informed Consent: I have reviewed the patients History and Physical, chart, labs and discussed the procedure including the risks, benefits and alternatives for the proposed anesthesia with the patient or authorized representative who has indicated his/her understanding and acceptance.       Plan Discussed with: CRNA  Anesthesia Plan Comments:         Anesthesia Quick  Evaluation

## 2021-06-15 NOTE — MAU Note (Signed)
Pt was seen here earlier today for poss PPROM and ctxs. Amnisure negative. Returns tonight with stronger ctxs and bloody show.

## 2021-06-15 NOTE — Telephone Encounter (Signed)
Pt called stating that she thinks she has been leaking fluid since last night. States babies are moving ok but not as well as usual. Asked if she had noticed any bleeding. Pt states she notices some blood when wiping. Advised patient to go to MAU for evaluation due to possible ROM. Pt agreed and verbalized understanding.

## 2021-06-15 NOTE — MAU Provider Note (Signed)
[name] is a 30 y.o. G4P1021  At 42w5dhere with complaints of leaking of fluid last night around 11 pm last night. She kept seeing frequent discharge when she wiped; she denies recent intercourse, she denies contractions, decreased fetal movements. She says that discharge comes out when she wipes; it is not running down her leg. She denies fever, SOB, chest pain. She  has a high risk pregnancy complicated by Di-Di twins; plan for delivery at 36 weeks. She denies HA, blurry vision, floating spots, RUQ pain.   History     CSN: [id number] Arrival date and time: 06/15/21 1033   Event Date/Time   First Provider Initiated Contact with Patient 06/15/21 1145      Chief Complaint  Patient presents with   Rupture of Membranes   Vaginal Discharge The patient's primary symptoms include vaginal discharge. The patient's pertinent negatives include no vaginal bleeding. This is a new problem. The current episode started yesterday. The problem occurs constantly. The problem has been unchanged. The patient is experiencing no pain. Pertinent negatives include no constipation, diarrhea, dysuria or fever. The vaginal discharge was bloody, watery and clear. There has been no bleeding. She has not been passing clots. She has not been passing tissue. Nothing aggravates the symptoms.   OB History     Gravida  4   Para  1   Term  1   Preterm      AB  2   Living  1      SAB  1   IAB  1   Ectopic      Multiple      Live Births  1           History reviewed. No pertinent past medical history.  No past surgical history on file.  History reviewed. No pertinent family history.  Social History   Tobacco Use   Smoking status: Former    Packs/day: 0.25    Types: Cigarettes    Quit date: 2015    Years since quitting: 7.8   Smokeless tobacco: Never  Vaping Use   Vaping Use: Never used  Substance Use Topics   Alcohol use: No   Drug use: No    Allergies: No Known  Allergies  Medications Prior to Admission  Medication Sig Dispense Refill Last Dose   aspirin EC 81 MG tablet Take 1 tablet (81 mg total) by mouth daily. Swallow whole. 30 tablet 11 Past Week   Prenatal Vit-Fe Fumarate-FA (PRENATAL MULTIVITAMIN) TABS tablet Take 1 tablet by mouth daily at 12 noon.   Past Week   Blood Pressure Monitoring (BLOOD PRESSURE KIT) DEVI 1 kit by Does not apply route as needed. Large Cuff 1 each 0     Review of Systems  Constitutional: Negative.  Negative for fever.  HENT: Negative.    Respiratory: Negative.    Cardiovascular: Negative.   Gastrointestinal: Negative.  Negative for constipation and diarrhea.  Genitourinary:  Positive for vaginal discharge. Negative for dysuria.  Allergic/Immunologic: Negative.   Neurological: Negative.   Physical Exam   Blood pressure 137/89, pulse (!) 103, temperature 98.1 F (36.7 C), temperature source Oral, resp. rate 15, height 5' 8"  (1.727 m), weight 124 kg, last menstrual period 10/29/2020, SpO2 99 %, unknown if currently breastfeeding.  Physical Exam Constitutional:      Appearance: Normal appearance.  Cardiovascular:     Rate and Rhythm: Normal rate.  Pulmonary:     Effort: Pulmonary effort is normal.  Genitourinary:    General: Normal vulva.     Vagina: Vaginal discharge present.     Comments: NEFG; small amount of bloody discharge in the vagina, no pooling, no leaking on valsalva Musculoskeletal:        General: Normal range of motion.  Skin:    General: Skin is warm.  Neurological:     General: No focal deficit present.     Mental Status: She is alert.    MAU Course  Procedures  MDM -sterile speculum is negative for fluid; amnisure is negative.  -wet prep shows presence of clue cells -no LOF while in MAU -patient elevated blood pressures while in MAU; unclear if this is due to anxiety or development of GHTN or pre-e.  -CBC, CMP , UA were normal -NST: Baby A: 130 bpm, mod var, present acel, no  decels, no contractions Baby B: 135bpm mod var, present acel, no decels, no contractions Patient Vitals for the past 24 hrs:  BP Temp Temp src Pulse Resp SpO2 Height Weight  06/15/21 1347 (!) 144/89 98 F (36.7 C) Oral 90 17 99 % -- --  06/15/21 1331 138/88 -- -- 83 -- -- -- --  06/15/21 1316 (!) 140/94 -- -- 91 -- -- -- --  06/15/21 1301 127/87 -- -- 97 -- -- -- --  06/15/21 1246 136/84 -- -- 91 -- -- -- --  06/15/21 1231 (!) 145/101 -- -- 98 -- -- -- --  06/15/21 1216 129/77 -- -- (!) 110 -- -- -- --  06/15/21 1202 126/85 -- -- (!) 103 -- -- -- --  06/15/21 1146 (!) 133/91 -- -- 97 -- -- -- --  06/15/21 1131 126/78 -- -- (!) 111 -- -- -- --  06/15/21 1116 (!) 134/97 -- -- (!) 113 -- -- -- --  06/15/21 1103 137/89 -- -- (!) 103 -- -- -- --  06/15/21 1042 (!) 143/94 98.1 F (36.7 C) Oral (!) 122 15 99 % 5' 8"  (1.727 m) 124 kg    Assessment and Plan   1. Bacterial vaginosis   -RX for Flagyl given -pre-e labs were normal -keep appt on Friday, 10/28 -BP elevated, discharged home with BP check on Friday at [facility]: patient given return precautions and desired discharge to pick up her child -all questions answered    Mervyn Skeeters Miaisabella Bacorn 06/15/2021, 11:59 AM

## 2021-06-15 NOTE — MAU Note (Signed)
.  Crystal Cannon is a 30 y.o. at [redacted]w[redacted]d here in MAU reporting: started leaking fluid last night around 2300. States that when she wipes it is yellow-tinged but when it leaks down her legs it is clear. States she is having occ ctx and a little bit of bloody discharge. States the babies are not moving as much. Denies HA, blurry vision, or epigastric pain.   Pain score: 7 Vitals:   06/15/21 1042  BP: (!) 143/94  Pulse: (!) 122  Resp: 15  Temp: 98.1 F (36.7 C)  SpO2: 99%     FHT:A: 140     B: 138 Lab orders placed from triage:  UA

## 2021-06-16 ENCOUNTER — Encounter (HOSPITAL_COMMUNITY): Payer: Self-pay | Admitting: Obstetrics and Gynecology

## 2021-06-16 ENCOUNTER — Inpatient Hospital Stay (HOSPITAL_COMMUNITY): Payer: Medicaid Other | Admitting: Anesthesiology

## 2021-06-16 DIAGNOSIS — B9689 Other specified bacterial agents as the cause of diseases classified elsewhere: Secondary | ICD-10-CM | POA: Diagnosis not present

## 2021-06-16 DIAGNOSIS — O134 Gestational [pregnancy-induced] hypertension without significant proteinuria, complicating childbirth: Secondary | ICD-10-CM | POA: Diagnosis not present

## 2021-06-16 DIAGNOSIS — Z3A32 32 weeks gestation of pregnancy: Secondary | ICD-10-CM

## 2021-06-16 DIAGNOSIS — Z20822 Contact with and (suspected) exposure to covid-19: Secondary | ICD-10-CM | POA: Diagnosis not present

## 2021-06-16 DIAGNOSIS — O321XX0 Maternal care for breech presentation, not applicable or unspecified: Secondary | ICD-10-CM | POA: Diagnosis not present

## 2021-06-16 DIAGNOSIS — O99891 Other specified diseases and conditions complicating pregnancy: Secondary | ICD-10-CM | POA: Diagnosis not present

## 2021-06-16 DIAGNOSIS — O321XX2 Maternal care for breech presentation, fetus 2: Secondary | ICD-10-CM | POA: Diagnosis not present

## 2021-06-16 DIAGNOSIS — O139 Gestational [pregnancy-induced] hypertension without significant proteinuria, unspecified trimester: Secondary | ICD-10-CM | POA: Diagnosis present

## 2021-06-16 DIAGNOSIS — O365931 Maternal care for other known or suspected poor fetal growth, third trimester, fetus 1: Secondary | ICD-10-CM | POA: Diagnosis not present

## 2021-06-16 DIAGNOSIS — O99214 Obesity complicating childbirth: Secondary | ICD-10-CM | POA: Diagnosis not present

## 2021-06-16 DIAGNOSIS — O42013 Preterm premature rupture of membranes, onset of labor within 24 hours of rupture, third trimester: Secondary | ICD-10-CM

## 2021-06-16 DIAGNOSIS — Z7982 Long term (current) use of aspirin: Secondary | ICD-10-CM | POA: Diagnosis not present

## 2021-06-16 DIAGNOSIS — O30043 Twin pregnancy, dichorionic/diamniotic, third trimester: Secondary | ICD-10-CM | POA: Diagnosis not present

## 2021-06-16 DIAGNOSIS — O99820 Streptococcus B carrier state complicating pregnancy: Secondary | ICD-10-CM

## 2021-06-16 DIAGNOSIS — O321XX1 Maternal care for breech presentation, fetus 1: Secondary | ICD-10-CM | POA: Diagnosis not present

## 2021-06-16 DIAGNOSIS — N760 Acute vaginitis: Secondary | ICD-10-CM | POA: Diagnosis not present

## 2021-06-16 DIAGNOSIS — Z87891 Personal history of nicotine dependence: Secondary | ICD-10-CM | POA: Diagnosis not present

## 2021-06-16 LAB — GC/CHLAMYDIA PROBE AMP (~~LOC~~) NOT AT ARMC
Chlamydia: NEGATIVE
Comment: NEGATIVE
Comment: NORMAL
Neisseria Gonorrhea: NEGATIVE

## 2021-06-16 LAB — CBC
HCT: 30.3 % — ABNORMAL LOW (ref 36.0–46.0)
Hemoglobin: 10.1 g/dL — ABNORMAL LOW (ref 12.0–15.0)
MCH: 28.9 pg (ref 26.0–34.0)
MCHC: 33.3 g/dL (ref 30.0–36.0)
MCV: 86.8 fL (ref 80.0–100.0)
Platelets: 187 10*3/uL (ref 150–400)
RBC: 3.49 MIL/uL — ABNORMAL LOW (ref 3.87–5.11)
RDW: 12.4 % (ref 11.5–15.5)
WBC: 10.6 10*3/uL — ABNORMAL HIGH (ref 4.0–10.5)
nRBC: 0 % (ref 0.0–0.2)

## 2021-06-16 LAB — TYPE AND SCREEN
ABO/RH(D): O POS
Antibody Screen: NEGATIVE

## 2021-06-16 LAB — PROTEIN / CREATININE RATIO, URINE
Creatinine, Urine: 157.31 mg/dL
Protein Creatinine Ratio: 0.18 mg/mg{Cre} — ABNORMAL HIGH (ref 0.00–0.15)
Total Protein, Urine: 29 mg/dL

## 2021-06-16 MED ORDER — MEPERIDINE HCL 25 MG/ML IJ SOLN
INTRAMUSCULAR | Status: AC
  Filled 2021-06-16: qty 1

## 2021-06-16 MED ORDER — OXYCODONE HCL 5 MG/5ML PO SOLN
5.0000 mg | Freq: Once | ORAL | Status: DC | PRN
Start: 2021-06-16 — End: 2021-06-16

## 2021-06-16 MED ORDER — ACETAMINOPHEN 500 MG PO TABS
1000.0000 mg | ORAL_TABLET | Freq: Four times a day (QID) | ORAL | Status: DC
  Administered 2021-06-16 – 2021-06-18 (×9): 1000 mg via ORAL
  Filled 2021-06-16 (×11): qty 2

## 2021-06-16 MED ORDER — MENTHOL 3 MG MT LOZG: 1.0000 | LOZENGE | OROMUCOSAL | Status: DC | PRN

## 2021-06-16 MED ORDER — IBUPROFEN 600 MG PO TABS
600.0000 mg | ORAL_TABLET | Freq: Four times a day (QID) | ORAL | Status: DC
  Administered 2021-06-17 – 2021-06-18 (×6): 600 mg via ORAL
  Filled 2021-06-16 (×6): qty 1

## 2021-06-16 MED ORDER — OXYCODONE HCL 5 MG PO TABS
5.0000 mg | ORAL_TABLET | ORAL | Status: DC | PRN
  Administered 2021-06-16 – 2021-06-18 (×5): 10 mg via ORAL
  Filled 2021-06-16 (×5): qty 2

## 2021-06-16 MED ORDER — KETOROLAC TROMETHAMINE 30 MG/ML IJ SOLN: 30.0000 mg | Freq: Once | INTRAMUSCULAR | Status: DC | PRN

## 2021-06-16 MED ORDER — DIPHENHYDRAMINE HCL 25 MG PO CAPS: 25.0000 mg | ORAL_CAPSULE | ORAL | Status: DC | PRN

## 2021-06-16 MED ORDER — ACETAMINOPHEN 500 MG PO TABS: 1000.0000 mg | ORAL_TABLET | Freq: Four times a day (QID) | ORAL | Status: DC

## 2021-06-16 MED ORDER — MORPHINE SULFATE (PF) 0.5 MG/ML IJ SOLN
INTRAMUSCULAR | Status: DC | PRN
  Administered 2021-06-16: .15 mg via INTRATHECAL

## 2021-06-16 MED ORDER — MEPERIDINE HCL 25 MG/ML IJ SOLN: 6.2500 mg | INTRAMUSCULAR | Status: DC | PRN

## 2021-06-16 MED ORDER — PRENATAL MULTIVITAMIN CH
1.0000 | ORAL_TABLET | Freq: Every day | ORAL | Status: DC
  Administered 2021-06-16 – 2021-06-18 (×3): 1 via ORAL
  Filled 2021-06-16 (×3): qty 1

## 2021-06-16 MED ORDER — SCOPOLAMINE 1 MG/3DAYS TD PT72
1.0000 | MEDICATED_PATCH | Freq: Once | TRANSDERMAL | Status: DC
  Administered 2021-06-16: 1.5 mg via TRANSDERMAL

## 2021-06-16 MED ORDER — SIMETHICONE 80 MG PO CHEW: 80.0000 mg | CHEWABLE_TABLET | ORAL | Status: DC | PRN

## 2021-06-16 MED ORDER — LACTATED RINGERS IV SOLN: INTRAVENOUS | Status: DC

## 2021-06-16 MED ORDER — OXYCODONE HCL 5 MG PO TABS: 5.0000 mg | ORAL_TABLET | Freq: Once | ORAL | Status: DC | PRN

## 2021-06-16 MED ORDER — NALBUPHINE HCL 10 MG/ML IJ SOLN
5.0000 mg | Freq: Once | INTRAMUSCULAR | Status: DC | PRN
Start: 2021-06-16 — End: 2021-06-18

## 2021-06-16 MED ORDER — SODIUM CHLORIDE 0.9% FLUSH: 3.0000 mL | INTRAVENOUS | Status: DC | PRN

## 2021-06-16 MED ORDER — OXYTOCIN-SODIUM CHLORIDE 30-0.9 UT/500ML-% IV SOLN: 2.5000 [IU]/h | INTRAVENOUS | Status: AC

## 2021-06-16 MED ORDER — MEASLES, MUMPS & RUBELLA VAC IJ SOLR
0.5000 mL | Freq: Once | INTRAMUSCULAR | Status: DC
  Filled 2021-06-16: qty 0.5

## 2021-06-16 MED ORDER — KETOROLAC TROMETHAMINE 30 MG/ML IJ SOLN
30.0000 mg | Freq: Four times a day (QID) | INTRAMUSCULAR | Status: AC
  Administered 2021-06-16 – 2021-06-17 (×4): 30 mg via INTRAVENOUS
  Filled 2021-06-16 (×4): qty 1

## 2021-06-16 MED ORDER — KETOROLAC TROMETHAMINE 30 MG/ML IJ SOLN: 30.0000 mg | Freq: Four times a day (QID) | INTRAMUSCULAR | Status: DC | PRN

## 2021-06-16 MED ORDER — HYDROMORPHONE HCL 1 MG/ML IJ SOLN: 0.2500 mg | INTRAMUSCULAR | Status: DC | PRN

## 2021-06-16 MED ORDER — BUPIVACAINE IN DEXTROSE 0.75-8.25 % IT SOLN
INTRATHECAL | Status: DC | PRN
  Administered 2021-06-16: 1.8 mL via INTRATHECAL

## 2021-06-16 MED ORDER — NALBUPHINE HCL 10 MG/ML IJ SOLN: 5.0000 mg | INTRAMUSCULAR | Status: DC | PRN

## 2021-06-16 MED ORDER — NALOXONE HCL 4 MG/10ML IJ SOLN
1.0000 ug/kg/h | INTRAVENOUS | Status: DC | PRN
  Filled 2021-06-16: qty 5

## 2021-06-16 MED ORDER — DIPHENHYDRAMINE HCL 50 MG/ML IJ SOLN: 12.5000 mg | INTRAMUSCULAR | Status: DC | PRN

## 2021-06-16 MED ORDER — TETANUS-DIPHTH-ACELL PERTUSSIS 5-2.5-18.5 LF-MCG/0.5 IM SUSY
0.5000 mL | PREFILLED_SYRINGE | Freq: Once | INTRAMUSCULAR | Status: DC
  Filled 2021-06-16: qty 0.5

## 2021-06-16 MED ORDER — PROMETHAZINE HCL 25 MG/ML IJ SOLN: 6.2500 mg | INTRAMUSCULAR | Status: DC | PRN

## 2021-06-16 MED ORDER — SIMETHICONE 80 MG PO CHEW
80.0000 mg | CHEWABLE_TABLET | Freq: Three times a day (TID) | ORAL | Status: DC
  Administered 2021-06-16 – 2021-06-18 (×7): 80 mg via ORAL
  Filled 2021-06-16 (×7): qty 1

## 2021-06-16 MED ORDER — OXYTOCIN-SODIUM CHLORIDE 30-0.9 UT/500ML-% IV SOLN
INTRAVENOUS | Status: DC | PRN
  Administered 2021-06-16: 400 mL via INTRAVENOUS

## 2021-06-16 MED ORDER — WITCH HAZEL-GLYCERIN EX PADS: 1.0000 "application " | MEDICATED_PAD | CUTANEOUS | Status: DC | PRN

## 2021-06-16 MED ORDER — CEFAZOLIN IN SODIUM CHLORIDE 3-0.9 GM/100ML-% IV SOLN
INTRAVENOUS | Status: AC
  Filled 2021-06-16: qty 100

## 2021-06-16 MED ORDER — LACTATED RINGERS IV SOLN: INTRAVENOUS | Status: DC | PRN

## 2021-06-16 MED ORDER — NALOXONE HCL 0.4 MG/ML IJ SOLN: 0.4000 mg | INTRAMUSCULAR | Status: DC | PRN

## 2021-06-16 MED ORDER — MEPERIDINE HCL 25 MG/ML IJ SOLN
INTRAMUSCULAR | Status: DC | PRN
  Administered 2021-06-16 (×2): 12.5 mg via INTRAVENOUS

## 2021-06-16 MED ORDER — SENNOSIDES-DOCUSATE SODIUM 8.6-50 MG PO TABS
2.0000 | ORAL_TABLET | Freq: Every day | ORAL | Status: DC
  Administered 2021-06-17 – 2021-06-18 (×2): 2 via ORAL
  Filled 2021-06-16 (×2): qty 2

## 2021-06-16 MED ORDER — MEDROXYPROGESTERONE ACETATE 150 MG/ML IM SUSP: 150.0000 mg | INTRAMUSCULAR | Status: DC | PRN

## 2021-06-16 MED ORDER — DIPHENHYDRAMINE HCL 25 MG PO CAPS
25.0000 mg | ORAL_CAPSULE | Freq: Four times a day (QID) | ORAL | Status: DC | PRN
  Administered 2021-06-16 (×2): 25 mg via ORAL
  Filled 2021-06-16 (×2): qty 1

## 2021-06-16 MED ORDER — DEXTROSE 5 % IV SOLN
INTRAVENOUS | Status: DC | PRN
  Administered 2021-06-16: 3 g via INTRAVENOUS

## 2021-06-16 MED ORDER — PHENYLEPHRINE HCL-NACL 20-0.9 MG/250ML-% IV SOLN
INTRAVENOUS | Status: DC | PRN
  Administered 2021-06-16: 60 ug/min via INTRAVENOUS

## 2021-06-16 MED ORDER — ONDANSETRON HCL 4 MG/2ML IJ SOLN
INTRAMUSCULAR | Status: DC | PRN
  Administered 2021-06-16: 4 mg via INTRAVENOUS

## 2021-06-16 MED ORDER — COCONUT OIL OIL: 1.0000 "application " | TOPICAL_OIL | Status: DC | PRN

## 2021-06-16 MED ORDER — ENOXAPARIN SODIUM 40 MG/0.4ML IJ SOSY: 40.0000 mg | PREFILLED_SYRINGE | INTRAMUSCULAR | Status: DC

## 2021-06-16 MED ORDER — DEXAMETHASONE SODIUM PHOSPHATE 4 MG/ML IJ SOLN
INTRAMUSCULAR | Status: DC | PRN
  Administered 2021-06-16: 4 mg via INTRAVENOUS

## 2021-06-16 MED ORDER — DIBUCAINE (PERIANAL) 1 % EX OINT: 1.0000 "application " | TOPICAL_OINTMENT | CUTANEOUS | Status: DC | PRN

## 2021-06-16 MED ORDER — FENTANYL CITRATE (PF) 100 MCG/2ML IJ SOLN
INTRAMUSCULAR | Status: DC | PRN
  Administered 2021-06-16: 15 ug via INTRATHECAL

## 2021-06-16 MED ORDER — ENOXAPARIN SODIUM 60 MG/0.6ML IJ SOSY
60.0000 mg | PREFILLED_SYRINGE | INTRAMUSCULAR | Status: DC
  Administered 2021-06-16 – 2021-06-17 (×2): 60 mg via SUBCUTANEOUS
  Filled 2021-06-16 (×2): qty 0.6

## 2021-06-16 MED ORDER — DEXMEDETOMIDINE (PRECEDEX) IN NS 20 MCG/5ML (4 MCG/ML) IV SYRINGE
PREFILLED_SYRINGE | INTRAVENOUS | Status: DC | PRN
  Administered 2021-06-16: 8 ug via INTRAVENOUS
  Administered 2021-06-16: 12 ug via INTRAVENOUS

## 2021-06-16 MED ORDER — ONDANSETRON HCL 4 MG/2ML IJ SOLN: 4.0000 mg | Freq: Three times a day (TID) | INTRAMUSCULAR | Status: DC | PRN

## 2021-06-16 MED ORDER — KETOROLAC TROMETHAMINE 30 MG/ML IJ SOLN
INTRAMUSCULAR | Status: AC
  Filled 2021-06-16: qty 1

## 2021-06-16 MED ORDER — SCOPOLAMINE 1 MG/3DAYS TD PT72
MEDICATED_PATCH | TRANSDERMAL | Status: AC
  Filled 2021-06-16: qty 1

## 2021-06-16 NOTE — Anesthesia Procedure Notes (Signed)
Spinal  Patient location during procedure: OR Start time: 06/16/2021 12:33 AM End time: 06/16/2021 12:35 AM Reason for block: surgical anesthesia Staffing Performed: anesthesiologist  Anesthesiologist: Lannie Fields, DO Preanesthetic Checklist Completed: patient identified, IV checked, risks and benefits discussed, surgical consent, monitors and equipment checked, pre-op evaluation and timeout performed Spinal Block Patient position: sitting Prep: DuraPrep and site prepped and draped Patient monitoring: cardiac monitor, continuous pulse ox and blood pressure Approach: midline Location: L3-4 Injection technique: single-shot Needle Needle type: Pencan  Needle gauge: 24 G Needle length: 9 cm Assessment Sensory level: T6 Events: CSF return Additional Notes Functioning IV was confirmed and monitors were applied. Sterile prep and drape, including hand hygiene and sterile gloves were used. The patient was positioned and the spine was prepped. The skin was anesthetized with lidocaine.  Free flow of clear CSF was obtained prior to injecting local anesthetic into the CSF.  The spinal needle aspirated freely following injection.  The needle was carefully withdrawn.  The patient tolerated the procedure well.

## 2021-06-16 NOTE — Anesthesia Postprocedure Evaluation (Signed)
Anesthesia Post Note  Patient: Crystal Cannon  Procedure(s) Performed: CESAREAN SECTION     Patient location during evaluation: PACU Anesthesia Type: Spinal Level of consciousness: awake and alert and oriented Pain management: pain level controlled Vital Signs Assessment: post-procedure vital signs reviewed and stable Respiratory status: spontaneous breathing, nonlabored ventilation and respiratory function stable Cardiovascular status: blood pressure returned to baseline and stable Postop Assessment: no headache, no backache, spinal receding and patient able to bend at knees Anesthetic complications: no   No notable events documented.  Last Vitals:  Vitals:   06/16/21 0345 06/16/21 0435  BP:    Pulse:    Resp:  17  Temp:  36.8 C  SpO2: 97%     Last Pain:  Vitals:   06/16/21 0435  TempSrc: Oral  PainSc:    Pain Goal: Patients Stated Pain Goal: 0 (06/15/21 2251)                 Lannie Fields

## 2021-06-16 NOTE — Discharge Summary (Signed)
Postpartum Discharge Summary      Patient Name: Crystal Cannon DOB: [birth year] MRN: [id number]  Date of admission: 06/15/2021 Delivery date:   Garnell, Begeman [[id number]]  06/16/2021    Cartha, Rotert Yenifer [[id number]]  06/16/2021  Delivering provider:    Riley Churches Rashi [[id number]]  CONSTANT, PEGGY    Morrison, Masser Jonica [[id number]]  CONSTANT, PEGGY  Date of discharge: 06/18/2021  Admitting diagnosis: Cesarean delivery delivered [O82] Intrauterine pregnancy: [redacted]w[redacted]d    Secondary diagnosis:  Principal Problem:   Cesarean delivery delivered Active Problems:   Supervision of other normal pregnancy, antepartum   Dichorionic diamniotic twin gestation   Rubella non-immune status, antepartum   GBS bacteriuria   Gestational hypertension  Additional problems:     Discharge diagnosis: Preterm Pregnancy Delivered                                              Post partum procedures: Augmentation: N/A Complications: None  Hospital course: Onset of Labor With Unplanned C/S   30y.o. yo [id number] 320w6dresented to [facility]U in preterm labor on 06/15/2021. Ultrasound confirmed breech/breech presentation. The patient went for cesarean section due to Malpresentation. Delivery details as follows: Membrane Rupture Time/Date:    LeElectra, Paladinoiffanie [0[[id number]]12:58 AM    LeAjeenah, Heinyiffanie [0[[id number]]1:01 AM ,   LeMakela, Niehoffiffanie [[id number]]06/16/2021    LeDynisha, Dueiffanie [[id number]]06/16/2021   Delivery Method:   LeMaleena, Eddlemaniffanie [[id number]]C-Section, Low Transverse    LeAlmas, Rakeiffanie [0[[id number]]C-Section, Low Transverse  Details of operation can be found in separate operative note. Patient had an uncomplicated postpartum course.  She is ambulating,tolerating a regular diet, passing flatus, and urinating well.  Patient is discharged home in stable condition 06/18/21.  Newborn Data: Birth date:   LeLene, Mckay0[[id number]] 06/16/2021    LeUbah, Radkeiffanie [0[[id number]]06/16/2021  Birth time:   LeKhrystal, Jeanmarieiffanie [[id number]]1:00 AM    LeAndrey Spearmaniffanie [0[[id number]]1:02 AM  Gender:   LeNataly, Pacificoiffanie [[id number]]Female    LeAmberley, Hamleriffanie [[id number]]Female  Living status:   LeEla, Moffatiffanie [[id number]]Living    LeReema, Chickiffanie [[id number]]Living  Apgars:   LeDamia, Bobrowskiiffanie [0[[id number]]7 Biehle  LeEriyana, Sweeteniffanie [0[[id number]]8 ,   LeReeya, Boundiffanie [0[[id number]]9    LeYassmine, Tammiffanie [0[[id number]]9  Weight:   LeOurania, Hamleriffanie [0[[id number]]2420 g    LeAditi, Roviraiffanie [0[[id number]]1710 g   Magnesium Sulfate received: No BMZ received: Yes Rhophylac: N/A MMR: Yes - needs postpartum  T-DaP: Offered postpartum Flu: No  Transfusion:No  Physical exam  Vitals:   06/17/21 2008 06/17/21 2323 06/18/21 0608 06/18/21 0956  BP: (!) 143/86 129/83 120/77 133/79  Pulse: 88 77 83 83  Resp: _0 Temp: 97.7 F (36.5 C) 97.8 F (36.6 C) 97.7 F (36.5 C) 97.6 F (36.4 C)  TempSrc: Oral Oral Oral Oral  SpO2: 99% 99% 99% 100%  Height:       General: alert, cooperative, and no distress Lochia: appropriate Uterine Fundus: firm Incision: Healing well with no significant drainage DVT Evaluation: No evidence of DVT seen on physical  exam.  Labs: Lab Results  Component Value Date   WBC 10.6 (H) 06/16/2021   HGB 10.1 (L) 06/16/2021   HCT 30.3 (L) 06/16/2021   MCV 86.8 06/16/2021   PLT 187 06/16/2021   CMP Latest Ref Rng & Units 06/15/2021  Glucose 70 - 99 mg/dL 102(H)  BUN 6 - 20 mg/dL 5(L)  Creatinine 0.44 - 1.00 mg/dL 0.55  Sodium 135 - 145 mmol/L 134(L)  Potassium 3.5 - 5.1 mmol/L 3.9  Chloride 98 - 111 mmol/L 102  CO2 22 - 32 mmol/L 24  Calcium 8.9 - 10.3 mg/dL 8.6(L)  Total Protein 6.5 - 8.1 g/dL 6.0(L)  Total Bilirubin 0.3 - 1.2 mg/dL 0.4  Alkaline Phos 38 - 126 U/L 122  AST 15 - 41 U/L 12(L)  ALT 0 - 44 U/L 10    Edinburgh Score: No flowsheet data found.   After visit meds:  Allergies as of 06/18/2021   No Known Allergies      Medication List     TAKE these medications    aspirin EC 81 MG tablet Take 1 tablet (81 mg total) by mouth daily. Swallow whole.   Blood Pressure Kit Devi 1 kit by Does not apply route as needed. Large Cuff   ibuprofen 600 MG tablet Commonly known as: ADVIL Take 1 tablet (600 mg total) by mouth every 6 (six) hours.   metroNIDAZOLE 500 MG tablet Commonly known as: FLAGYL Take 1 tablet (500 mg total) by mouth 2 (two) times daily.   oxyCODONE 5 MG immediate release tablet Commonly known as: Oxy IR/ROXICODONE Take 1-2 tablets (5-10 mg total) by mouth every 4 (four) hours as needed for moderate pain.   prenatal multivitamin Tabs tablet Take 1 tablet by mouth daily at 12 noon.               Discharge Care Instructions  (From admission, onward)           Start     Ordered   06/18/21 0000  Leave dressing on - Keep it clean, dry, and intact until clinic visit        06/18/21 1043             Discharge home in stable condition Infant Feeding:  OG Infant Disposition:NICU Discharge instruction: per After Visit Summary and Postpartum booklet. Activity: Advance as tolerated. Pelvic rest for 6 weeks.  Diet: routine diet Future Appointments: Future Appointments  Date Time Provider [place]  06/23/2021  8:55 AM Anyanwu, Sallyanne Havers, MD [place] None  07/22/2021  1:10 PM Constant, Vickii Chafe, MD [place] None   Follow up Visit:  [place] Follow up on 06/22/2021.   Why: post op visit, already scheduled Contact information: [address] [address] [phone]               Message sent to [facility] by Dr. Gwenlyn Perking on 06/16/21.   Please schedule this patient for a In person postpartum visit in 4 weeks with the following provider: MD. Additional  Postpartum F/U: Incision check 1 week and BP check 1 week  High risk pregnancy complicated by: HTN and twin gestation Delivery mode:     Chariah, Bailey [[id number]]  C-Section, Low Transverse    Dejane, Scheibe Geena [[id number]]  C-Section, Low Transverse  Anticipated Birth Control:  Unsure   06/18/2021 Florian Buff, MD

## 2021-06-16 NOTE — Transfer of Care (Signed)
Immediate Anesthesia Transfer of Care Note  Patient: Crystal Cannon  Procedure(s) Performed: CESAREAN SECTION  Patient Location: PACU  Anesthesia Type:Spinal  Level of Consciousness: awake, alert  and patient cooperative  Airway & Oxygen Therapy: Patient Spontanous Breathing  Post-op Assessment: Report given to RN and Post -op Vital signs reviewed and stable  Post vital signs: Reviewed and stable  Last Vitals:  Vitals Value Taken Time  BP 122/87 06/16/21 0156  Temp 36.6 C 06/16/21 0157  Pulse 97 06/16/21 0158  Resp 24 06/16/21 0158  SpO2 100 % 06/16/21 0158  Vitals shown include unvalidated device data.  Last Pain:  Vitals:   06/16/21 0157  TempSrc: Oral  PainSc:       Patients Stated Pain Goal: 0 (06/15/21 2251)  Complications: No notable events documented.

## 2021-06-16 NOTE — Op Note (Signed)
Crystal Cannon PROCEDURE DATE: 06/15/2021 - 06/16/2021  PREOPERATIVE DIAGNOSIS: Intrauterine pregnancy at  [redacted]w[redacted]d weeks gestation; Di-Di twins in preterm labor with fetal malpresentation  POSTOPERATIVE DIAGNOSIS: The same  PROCEDURE:     Cesarean Section  SURGEON:  Dr. Catalina Antigua  ASSISTANT: Dr. Mathis Fare  INDICATIONS: Crystal Cannon is a 30 y.o. [id number] at [redacted]w[redacted]d scheduled for cesarean section secondary to Di-Di twins in preterm labor with fetal malpresentation (breech/breech).  The risks of cesarean section discussed with the patient included but were not limited to: bleeding which may require transfusion or reoperation; infection which may require antibiotics; injury to bowel, bladder, ureters or other surrounding organs; injury to the fetus; need for additional procedures including hysterectomy in the event of a life-threatening hemorrhage; placental abnormalities wth subsequent pregnancies, incisional problems, thromboembolic phenomenon and other postoperative/anesthesia complications. The patient concurred with the proposed plan, giving informed written consent for the procedure.    FINDINGS:  Viable female infants in complete breech presentation x 2, nuchal cord x 1 for baby B.  Apgars for both infants not available at the time of the note.  Clear amniotic fluid x 2.  Intact placenta, three vessel cord. Cord evulsion for twin A during delivery of the placenta.  Normal uterus, fallopian tubes and ovaries bilaterally.  ANESTHESIA:    Spinal INTRAVENOUS FLUIDS:2000 ml ESTIMATED BLOOD LOSS: 601 ml ml URINE OUTPUT:  200 ml SPECIMENS: Placenta sent to pathology COMPLICATIONS: None immediate  PROCEDURE IN DETAIL:  The patient received intravenous antibiotics and had sequential compression devices applied to her lower extremities while in the preoperative area.  She was then taken to the operating room where anesthesia was induced and was found to be adequate. A foley catheter was placed into her  bladder and attached to [name] gravity. She was then placed in a dorsal supine position with a leftward tilt, and prepped and draped in a sterile manner. After an adequate timeout was performed, a Pfannenstiel skin incision was made with scalpel and carried through to the underlying layer of fascia. The fascia was incised in the midline and this incision was extended bilaterally using the Mayo scissors. Kocher clamps were applied to the superior aspect of the fascial incision and the underlying rectus muscles were dissected off bluntly. A similar process was carried out on the inferior aspect of the facial incision. The rectus muscles were separated in the midline bluntly and the peritoneum was entered bluntly. The Alexis self-retaining retractor was introduced into the abdominal cavity. Attention was turned to the lower uterine segment where a bladder flap was created, and a transverse hysterotomy was made with a scalpel and extended bilaterally bluntly. The infants were successfully delivered, and delayed cord clamping was performed for less than 1 minute for both infants. The cords were clamped and cut and infants were handed over to awaiting neonatology team. Uterine massage was then administered and the placenta delivered intact with three-vessel cord. Cord evulsion for twin A placenta, resulting in manual removal of the placenta. The uterus was cleared of clot and debris.  The hysterotomy was closed with 0 Vicryl in a running locked fashion, and an imbricating layer was also placed with a 0 Vicryl. Overall, excellent hemostasis was noted. The pelvis copiously irrigated and cleared of all clot and debris. Hemostasis was confirmed on all surfaces.  The peritoneum and the muscles were reapproximated using 0 vicryl interrupted stitches. The fascia was then closed using 0 Vicryl in a running fashion.  The subcutaneous layer was reapproximated with plain  gut and the skin was closed in a subcuticular fashion using  3.0 Vicryl. The patient tolerated the procedure well. Sponge, lap, instrument and needle counts were correct x 2. She was taken to the recovery room in stable condition.    Crystal Cannon  06/16/2021 1:33 AM

## 2021-06-17 MED ORDER — CYCLOBENZAPRINE HCL 10 MG PO TABS
10.0000 mg | ORAL_TABLET | Freq: Three times a day (TID) | ORAL | Status: DC | PRN
  Administered 2021-06-17 – 2021-06-18 (×2): 10 mg via ORAL
  Filled 2021-06-17 (×2): qty 1

## 2021-06-17 NOTE — Progress Notes (Signed)
Subjective: Postpartum Day 1: Cesarean Delivery Patient reports no complaints this morning. Denies HA or visual changes. Ambulating, voiding, tolerating and good oral pain control.   Objective: Vital signs in last 24 hours: Temp:  [97.9 F (36.6 C)-98.6 F (37 C)] 98.3 F (36.8 C) (10/27 0448) Pulse Rate:  [80-93] 93 (10/27 0448) Resp:  [16-19] 16 (10/27 0448) BP: (118-135)/(72-81) 125/77 (10/27 0448) SpO2:  [98 %-100 %] 98 % (10/27 0448)  Physical Exam:  General: alert Lochia: appropriate Uterine Fundus: firm Incision: healing well DVT Evaluation: No evidence of DVT seen on physical exam.  Recent Labs    06/15/21 1249 06/16/21 0509  HGB 10.9* 10.1*  HCT 32.9* 30.3*    Assessment/Plan: Status post Cesarean section. Doing well postoperatively.  Continue current care.  Crystal Cannon 06/17/2021, 7:48 AM

## 2021-06-17 NOTE — Progress Notes (Signed)
Patient screened out for psychosocial assessment since none of the following apply:  Psychosocial stressors documented in mother or baby's chart  Gestation less than 32 weeks  Code at delivery   Infant with anomalies Please contact the Clinical Social Worker if specific needs arise, by MOB's request, or if MOB scores greater than 9/yes to question 10 on Edinburgh Postpartum Depression Screen.  Josselyn Harkins, LCSW Clinical Social Worker [facility] Cell#: [phone]     

## 2021-06-17 NOTE — Lactation Note (Signed)
This note was copied from a baby's chart. Lactation Consultation Note  Patient Name: Crystal Cannon Hosking [id number] Date: 06/17/2021  Per RN, [name] is not planning to breast feed or pump. They are providing DBM upon provider recommendation, but their original plan was to provide ABM.   Age:29 hours     Consult Status  Complete    [name] 06/17/2021, 4:53 PM

## 2021-06-18 ENCOUNTER — Other Ambulatory Visit (HOSPITAL_COMMUNITY): Payer: Self-pay

## 2021-06-18 ENCOUNTER — Ambulatory Visit: Payer: Medicaid Other

## 2021-06-18 MED ORDER — OXYCODONE HCL 5 MG PO TABS
5.0000 mg | ORAL_TABLET | ORAL | 0 refills | Status: DC | PRN
  Filled 2021-06-18: qty 30, 3d supply, fill #0

## 2021-06-18 MED ORDER — IBUPROFEN 600 MG PO TABS
600.0000 mg | ORAL_TABLET | Freq: Four times a day (QID) | ORAL | 0 refills | Status: DC
  Filled 2021-06-18: qty 30, 8d supply, fill #0

## 2021-06-21 ENCOUNTER — Ambulatory Visit: Payer: Self-pay

## 2021-06-21 ENCOUNTER — Encounter (HOSPITAL_COMMUNITY): Payer: Self-pay | Admitting: Obstetrics and Gynecology

## 2021-06-21 NOTE — Lactation Note (Signed)
This note was copied from a baby's chart. Lactation Consultation Note LC called to room by RN. Mother is planned formula but requests LC assistance. Mother's milk is coming in today and she is uncomfortable. LC provided a hand pump and ice. Instructions provided to pump for relief. Any EBM to be fed to babies.   Patient Name: Crystal Cannon EFEOF'H Date: 06/21/2021   Age:29 days  Elder Negus 06/21/2021, 1:48 PM

## 2021-06-22 LAB — SURGICAL PATHOLOGY

## 2021-06-23 ENCOUNTER — Ambulatory Visit (INDEPENDENT_AMBULATORY_CARE_PROVIDER_SITE_OTHER): Payer: Medicaid Other | Admitting: Obstetrics & Gynecology

## 2021-06-23 ENCOUNTER — Encounter: Payer: Medicaid Other | Admitting: Obstetrics & Gynecology

## 2021-06-23 ENCOUNTER — Other Ambulatory Visit: Payer: Self-pay

## 2021-06-23 ENCOUNTER — Encounter: Payer: Self-pay | Admitting: Obstetrics & Gynecology

## 2021-06-23 VITALS — BP 138/88 | HR 91 | Ht 68.0 in | Wt 248.0 lb

## 2021-06-23 DIAGNOSIS — G8918 Other acute postprocedural pain: Secondary | ICD-10-CM

## 2021-06-23 DIAGNOSIS — Z4889 Encounter for other specified surgical aftercare: Secondary | ICD-10-CM

## 2021-06-23 DIAGNOSIS — O165 Unspecified maternal hypertension, complicating the puerperium: Secondary | ICD-10-CM

## 2021-06-23 MED ORDER — OXYCODONE HCL 5 MG PO TABS: 5.0000 mg | ORAL_TABLET | Freq: Four times a day (QID) | ORAL | 0 refills | Status: DC | PRN

## 2021-06-23 MED ORDER — NIFEDIPINE ER OSMOTIC RELEASE 30 MG PO TB24: 30.0000 mg | ORAL_TABLET | Freq: Every day | ORAL | 2 refills | Status: DC

## 2021-06-23 MED ORDER — IBUPROFEN 600 MG PO TABS: 600.0000 mg | ORAL_TABLET | Freq: Four times a day (QID) | ORAL | 0 refills | Status: DC

## 2021-06-23 NOTE — Progress Notes (Signed)
   POSTPARTUM OFFICE VISIT NOTE  History:   Crystal Cannon is a 30 y.o. [id number] here today for incision check and BP after cesarean delivery of preterm twins at [redacted]w[redacted]d on 06/16/21; due to preterm labor. She reports having increased pain on her right side but no fevers, drainage or other concerns.  Patient denies any headaches, visual symptoms, RUQ/epigastric pain or other concerning symptoms. Babies doing well in NICU.    History reviewed. No pertinent past medical history.  Past Surgical History:  Procedure Laterality Date   CESAREAN SECTION MULTI-GESTATIONAL N/A 06/15/2021   Procedure: CESAREAN SECTION MULTI-GESTATIONAL;  Surgeon: Catalina Antigua, MD;  Location: [facility] LD ORS;  Service: Obstetrics;  Laterality: N/A;   The following portions of the patient's history were reviewed and updated as appropriate: allergies, current medications, past family history, past medical history, past social history, past surgical history and problem list.    Review of Systems:  Pertinent items noted in HPI and remainder of comprehensive ROS otherwise negative.  Physical Exam:  BP 138/88   Pulse 91   Ht 5\' 8"  (1.727 m)   Wt 248 lb (112.5 kg)   LMP 10/29/2020   Breastfeeding No   BMI 37.71 kg/m  CONSTITUTIONAL: Well-developed, well-nourished female in no acute distress.  HEENT:  Normocephalic, atraumatic. External right and left ear normal. No scleral icterus.  NECK: Normal range of motion, supple, no masses noted on observation SKIN: No rash noted. Not diaphoretic. No erythema. No pallor. MUSCULOSKELETAL: Normal range of motion. No edema noted. NEUROLOGIC: Alert and oriented to person, place, and time. Normal muscle tone coordination. No cranial nerve deficit noted. PSYCHIATRIC: Normal mood and affect. Normal behavior. Normal judgment and thought content. CARDIOVASCULAR: Normal heart rate noted RESPIRATORY: Effort and breath sounds normal, no problems with respiration noted ABDOMEN: No masses noted.  No other overt distention noted.   INCISION: Honeycomb dressing removed, steristrips intact. No erythema, drainage, induration noted PELVIC: Deferred      Assessment and Plan:     1. Encounter for post surgical wound check Healing well. Just has some pain. See below.  2. Postoperative pain Pain meds refilled. Recommended adding Tylenol 1000 mg po q6h prn pain.  - ibuprofen (ADVIL) 600 MG tablet; Take 1 tablet (600 mg total) by mouth every 6 (six) hours.  Dispense: 30 tablet; Refill: 0 - oxyCODONE (OXY IR/ROXICODONE) 5 MG immediate release tablet; Take 1 tablet (5 mg total) by mouth every 6 (six) hours as needed for moderate pain.  Dispense: 20 tablet; Refill: 0  3. Postpartum hypertension Reviewed postpartum BPs, she had a few elevated BP. Started on Procardia today, will reevaluate next week. - NIFEdipine (PROCARDIA-XL/NIFEDICAL-XL) 30 MG 24 hr tablet; Take 1 tablet (30 mg total) by mouth daily.  Dispense: 30 tablet; Refill: 2  Routine preventative health maintenance measures emphasized. Please refer to After Visit Summary for other counseling recommendations.   Return in about 1 week (around 06/30/2021) for RN visit for BP check, incision recheck.     13/04/2021, MD, FACOG Obstetrician & Gynecologist, [facility] for [facility]

## 2021-06-23 NOTE — Patient Instructions (Signed)
Postpartum Hypertension Postpartum hypertension is high blood pressure that is higher than normal after childbirth. It usually starts within 1 to 2 days after delivery, but it can happen at any time for up to 6 weeks after delivery. For some women, medical treatment is required to prevent serious complications, such as seizures or stroke. What are the causes? The cause of this condition is not well understood. In some cases, the cause may not be known. Certain conditions may increase your risk. These include: Hypertension that existed before pregnancy (chronic hypertension). Hypertension that comes as a result of pregnancy (gestational hypertension). Hypertensive disorders during pregnancy or seizures in women who have high blood pressure during pregnancy. These conditions are called preeclampsia and eclampsia. A condition in which the liver, platelets, and red blood cells are damaged during pregnancy (HELLP syndrome). Obesity. Diabetes. What are the signs or symptoms? As with all types of hypertension, postpartum hypertension may not have any symptoms. Depending on how high your blood pressure is, you may experience: Headaches. These may be mild, moderate, or severe. They may also be steady, constant, or sudden in onset (thunderclap headache). Vision changes, such as blurry vision, flashing lights, or seeing spots. Nausea and vomiting. Pain in the upper right side of your abdomen. Shortness of breath. Difficulty breathing while lying down. A decrease in the amount of urine that you pass. How is this diagnosed? This condition may be diagnosed based on the results of a physical exam, blood pressure measurements, and blood and urine tests. You may also have other tests, such as a CT scan or an MRI, to check for other problems of postpartum hypertension. How is this treated? If blood pressure is high enough to require treatment, your options may include: Medicines to reduce blood pressure  (antihypertensives). Tell your health care provider if you are breastfeeding or if you plan to breastfeed. There are many antihypertensive medicines that are safe to take while breastfeeding. Treating medical conditions that are causing hypertension. Treating the complications of hypertension, such as seizures, stroke, or kidney problems. Your health care provider will also continue to monitor your blood pressure closely until it is within a safe range for you. Follow these instructions at home: Learn your goal blood pressure Two numbers make up your blood pressure. The first number is called systolic pressure. The second is called diastolic pressure. An example of a blood pressure reading is "120 over 80" (or 120/80). For most people, goal blood pressure is: First number: below 140. Second number: below 90. Your blood pressure is above normal even if only the top or bottom number is above normal. Know what to do before you take your blood pressure 30 minutes before you check your blood pressure: Do not drink caffeine. Do not drink alcohol. Avoid food and drink. Do not smoke. Do not exercise. 5 minutes before you check your blood pressure: Use the bathroom and urinate so that you have an empty bladder. Sit quietly in a dining room chair. Do not sit in a soft couch or an armchair. Do not talk. Know how to take your blood pressure To check your blood pressure, follow the instructions in the manual that came with your blood pressure monitor. If you have a digital blood pressure monitor, the instructions may be as follows: Sit up straight. Place your feet on the floor. Do not cross your ankles or legs. Rest your left arm at the level of your heart. You may rest it on a table, desk, or chair. Pull   up your shirt sleeve. Wrap the blood pressure cuff around the upper part of your left arm. The cuff should be 1 inch (2.5 cm) above your elbow. It is best to wrap the cuff around bare skin. Fit the  cuff snugly around your arm. You should be able to place only one finger between the cuff and your arm. Put the cord inside the groove of your elbow. Press the power button. Sit quietly while the cuff fills with air and loses air. Write down the numbers on the screen. These are your blood pressure readings. Wait 1-2 minutes and then repeat steps 1-10.  Record your blood pressure readings Follow your health care provider's instructions on how to record your blood pressure readings. If you were asked to use this form, follow these instructions: Get one reading in the morning (a.m.) before you take any medicines. Get one reading in the evening (p.m.) before supper. Take at least 2 readings with each blood pressure check. This makes sure the results are correct. Wait 1-2 minutes between measurements. Write down the results in the spaces on this form. Date: _______________________ a.m. _____________________(1st reading) _____________________(2nd reading) p.m. _____________________(1st reading) _____________________(2nd reading) Date: _______________________ a.m. _____________________(1st reading) _____________________(2nd reading) p.m. _____________________(1st reading) _____________________(2nd reading) Date: _______________________ a.m. _____________________(1st reading) _____________________(2nd reading) p.m. _____________________(1st reading) _____________________(2nd reading) Date: _______________________ a.m. _____________________(1st reading) _____________________(2nd reading) p.m. _____________________(1st reading) _____________________(2nd reading) Date: _______________________ a.m. _____________________(1st reading) _____________________(2nd reading) p.m. _____________________(1st reading) _____________________(2nd reading) General instructions Take over-the-counter and prescription medicines only as told by your health care provider. Do not use any products that contain nicotine  or tobacco. These products include cigarettes, chewing tobacco, and vaping devices, such as e-cigarettes. If you need help quitting, ask your health care provider. Check your blood pressure as often as recommended by your health care provider. Return to your normal activities as told by your health care provider. Ask your health care provider what activities are safe for you. Keep all follow-up visits. This is important. Contact a health care provider if: You have new symptoms, such as: A headache that does not get better. Dizziness. Visual changes. Nausea and vomiting. Get help right away if: You develop difficulty breathing. You have chest pain. You faint. You have any symptoms of a stroke. "BE FAST" is an easy way to remember the main warning signs of a stroke: B - Balance. Signs are dizziness, sudden trouble walking, or loss of balance. E - Eyes. Signs are trouble seeing or a sudden change in vision. F - Face. Signs are sudden weakness or numbness of the face, or the face or eyelid drooping on one side. A - Arms. Signs are weakness or numbness in an arm. This happens suddenly and usually on one side of the body. S - Speech. Signs are sudden trouble speaking, slurred speech, or trouble understanding what people say. T - Time. Time to call emergency services. Write down what time symptoms started. You have other signs of a stroke, such as: A sudden, severe headache with no known cause. Nausea or vomiting. Seizure. These symptoms may represent a serious problem that is an emergency. Do not wait to see if the symptoms will go away. Get medical help right away. Call your local emergency services (911 in the [place] Do not drive yourself to the hospital. Summary Postpartum hypertension is high blood pressure that remains higher than normal after childbirth. For some women, medical treatment is required to prevent serious complications, such as seizures or stroke. Follow your health care    provider's instructions on how to record your blood pressure readings. Keep all follow-up visits. This is important. This information is not intended to replace advice given to you by your health care provider. Make sure you discuss any questions you have with your health care provider. Document Revised: 05/05/2020 Document Reviewed: 05/05/2020 Elsevier Patient Education  2022 Elsevier Inc.  

## 2021-06-25 ENCOUNTER — Ambulatory Visit: Payer: Medicaid Other

## 2021-06-28 ENCOUNTER — Ambulatory Visit: Payer: Self-pay

## 2021-06-28 ENCOUNTER — Telehealth (HOSPITAL_COMMUNITY): Payer: Self-pay

## 2021-06-28 DIAGNOSIS — Z1331 Encounter for screening for depression: Secondary | ICD-10-CM

## 2021-06-28 NOTE — Telephone Encounter (Signed)
"  I'm doing alright. I'm having some soreness in my back and ribs." RN reviewed gas pain after surgery. RN encouraged patient to take pain medication as prescribed by her OB. "I have been, it helps." RN told patient to reach out to her OB about back and rib pain if it continues to bother her. "They said my incision looks good, my dressing is off. I have an appointment on Wednesday for a BP check and incision check." RN reviewed signs and symptoms of pre eclampsia with patient. "I'm not having any of those that I know of." RN asked patient if she has been checking her BP at home or if she has a way of checking it. "I haven't been checking it like I should. They gave me a cuff. I take the medication they gave me." RN encouraged patient to keep a check on her BP. Patient has no other questions or concerns about her healing.  Babies are in NICU.  EPDS score is 10. Referral placed for integrated behavioral health.   RN told patient about Maternal Mental Health Resources ([facility], National Maternal Mental Health Hotline, Postpartum Support International). Also told patient about [facility] support group offerings. Will e-mail these resources to patient as well.  Marcelino Duster [facility] 06/28/2021,1813

## 2021-06-28 NOTE — Lactation Note (Signed)
This note was copied from a baby's chart. Lactation Consultation Note Mother is formula feeding only. She is no longer pumping. LC services are complete.   Patient Name: Crystal Cannon [id number]'U Date: 06/28/2021   Age:29 days  Elder Negus 06/28/2021, 11:40 AM

## 2021-06-30 ENCOUNTER — Other Ambulatory Visit: Payer: Self-pay

## 2021-06-30 ENCOUNTER — Encounter: Payer: Self-pay | Admitting: Obstetrics & Gynecology

## 2021-06-30 ENCOUNTER — Telehealth: Payer: Self-pay | Admitting: Clinical

## 2021-06-30 ENCOUNTER — Ambulatory Visit (INDEPENDENT_AMBULATORY_CARE_PROVIDER_SITE_OTHER): Payer: Medicaid Other | Admitting: Obstetrics & Gynecology

## 2021-06-30 DIAGNOSIS — Z98891 History of uterine scar from previous surgery: Secondary | ICD-10-CM

## 2021-06-30 NOTE — Progress Notes (Signed)
2 weeks PP presents for BP and Incision check.  Reports no complaints today.

## 2021-06-30 NOTE — Telephone Encounter (Signed)
Unable to leave voicemail as "voicemail has not been set up"; left MyChart message for pt.

## 2021-06-30 NOTE — Progress Notes (Signed)
Subjective:     Crystal Cannon is a 30 y.o. female who presents to the clinic 2 weeks status post  c section  for  twins . Eating a regular diet without difficulty. Bowel movements are normal. Pain is controlled with current analgesics. Medications being used: ibuprofen (OTC).  The following portions of the patient's history were reviewed and updated as appropriate: allergies, current medications, past family history, past medical history, past social history, past surgical history, and problem list.  Review of Systems Musculoskeletal:positive for back pain    Objective:    BP 114/79   Pulse 76   Ht 5\' 8"  (1.727 m)   Wt 243 lb (110.2 kg)   LMP 10/29/2020   Breastfeeding No   BMI 36.95 kg/m  General:  alert, cooperative, and no distress  Abdomen: soft, bowel sounds active, non-tender  Incision:   healing well, no drainage, no erythema, no hernia, no seroma, no swelling, no dehiscence, incision well approximated     Assessment:    Doing well postoperatively. Operative findings again reviewed. Pathology report discussed.    Plan:    1. Continue any current medications. 2. Wound care discussed. 3. Activity restrictions: no lifting more than 15 pounds 4. Anticipated return to work: not applicable. 5. Follow up: 3 weeks   12/29/2020, MD 06/30/2021

## 2021-07-02 ENCOUNTER — Ambulatory Visit: Payer: Medicaid Other

## 2021-07-05 NOTE — BH Specialist Note (Signed)
Integrated Behavioral Health via Telemedicine Visit  07/05/2021 Baneen Wieseler [id number]  Less than 10 minute phone; One baby home from NICU last night, pediatrician this morning; Second baby coming home from NICU today; pt encouraged to continue taking prenatal vitamins daily, as recommended by medical provider due to low hemoglobin postpartum; highly encouraged to sleep when babies sleep the next few weeks for optimal wellbeing. Pt agrees to virtual visit on 12/13 at 10:45am; will Call Deriana Vanderhoef at [phone], as needed prior to scheduled visit.    Valetta Close Madeline Pho, LCSW

## 2021-07-14 ENCOUNTER — Ambulatory Visit: Payer: Medicaid Other | Admitting: Clinical

## 2021-07-21 NOTE — BH Specialist Note (Signed)
Integrated Behavioral Health via Telemedicine Visit  07/21/2021 Astoria Condon [id number]  Number of Integrated Behavioral Health visits: 1 Session Start time: 11:00  Session End time: 11:24 Total time:  24  Referring Provider: 24 Patient/Family location: Home [facility] Provider location: [facility]  All persons participating in visit: [name] and [facility] Crystal Cannon   Types of Service: Individual psychotherapy and Telephone visit  I connected with Crystal Cannon and/or Crystal Cannon's  n/a  via  Telephone or Video Enabled Telemedicine Application  (Video is Caregility application) and verified that I am speaking with the correct person using two identifiers. Discussed confidentiality: Yes   I discussed the limitations of telemedicine and the availability of in person appointments.  Discussed there is a possibility of technology failure and discussed alternative modes of communication if that failure occurs.  I discussed that engaging in this telemedicine visit, they consent to the provision of behavioral healthcare and the services will be billed under their insurance.  Patient and/or legal guardian expressed understanding and consented to Telemedicine visit: Yes   Presenting Concerns: Patient and/or family reports the following symptoms/concerns: Adjusting to twins both home from NICU; fatigue and lack of quality sleep; stress over difficulty finding correct formula for babies in local stores Duration of problem: Postpartum; Severity of problem: moderate  Patient and/or Family's Strengths/Protective Factors: Social connections, Concrete supports in place (healthy food, safe environments, etc.), Sense of purpose, and Physical Health (exercise, healthy diet, medication compliance, etc.)  Goals Addressed: Patient will:  Reduce symptoms of: stress   Increase knowledge and/or ability of: stress reduction   Demonstrate  ability to: Increase healthy adjustment to current life circumstances and Increase adequate support systems for patient/family  Progress towards Goals: Ongoing  Interventions: Interventions utilized:  Solution-Focused Strategies, Psychoeducation and/or Health Education, and Link to [employer] Standardized Assessments completed: GAD-7 and PHQ 9  Patient and/or Family Response: Pt agrees with treatment plan  Assessment: Patient currently experiencing Other specified counseling and Psychosocial stress.   Patient may benefit from psychoeducation and brief therapeutic interventions regarding coping with symptoms of current life stress .  Plan: Follow up with behavioral health clinician on : Call Lorine Iannaccone at [phone], as needed Behavioral recommendations:  -Continue accepting practical help from household members, as needed; continue prioritizing healthy sleep as much as able -Consider new mom online support group at [email or web address] and/or www.[email or web address].net, as needed -Call [facility] today to reschedule postpartum medical appointment (priority: discuss birth control options) Referral(s): Integrated Art gallery manager (In Clinic) and [employer]:  new mom online support groups  I discussed the assessment and treatment plan with the patient and/or parent/guardian. They were provided an opportunity to ask questions and all were answered. They agreed with the plan and demonstrated an understanding of the instructions.   They were advised to call back or seek an in-person evaluation if the symptoms worsen or if the condition fails to improve as anticipated.  Rae Lips, LCSW  Depression screen [facility] 2/9 08/03/2021 01/20/2021 01/13/2021  Decreased Interest 1 0 1  Down, Depressed, Hopeless 1 0 1  PHQ - 2 Score 2 0 2  Altered sleeping 2 1 1   Tired, decreased energy 3 1 1   Change in appetite 1 1 1   Feeling bad or failure about yourself  0 0 1  Trouble  concentrating 0 0 1  Moving slowly or fidgety/restless 0 0 0  Suicidal thoughts 0 0 0  PHQ-9 Score 8 3  7  Difficult doing work/chores - Not difficult at all Not difficult at all   GAD 7 : Generalized Anxiety Score 08/03/2021  Nervous, Anxious, on Edge 0  Control/stop worrying 2  Worry too much - different things 1  Trouble relaxing 0  Restless 0  Easily annoyed or irritable 0  Afraid - awful might happen 1  Total GAD 7 Score 4

## 2021-07-22 ENCOUNTER — Ambulatory Visit: Payer: Medicaid Other | Admitting: Obstetrics and Gynecology

## 2021-08-03 ENCOUNTER — Ambulatory Visit (INDEPENDENT_AMBULATORY_CARE_PROVIDER_SITE_OTHER): Payer: Medicaid Other | Admitting: Clinical

## 2021-08-03 DIAGNOSIS — Z658 Other specified problems related to psychosocial circumstances: Secondary | ICD-10-CM

## 2021-08-03 DIAGNOSIS — Z7189 Other specified counseling: Secondary | ICD-10-CM

## 2021-08-03 NOTE — Patient Instructions (Signed)
[facility] at [facility] [address] [place], [place] [address] [phone] (main office) [phone] (Miles Leyda's office)  New mom support groups:  [email or web address]  [email or web address]

## 2021-08-31 ENCOUNTER — Other Ambulatory Visit: Payer: Self-pay

## 2021-08-31 ENCOUNTER — Ambulatory Visit (INDEPENDENT_AMBULATORY_CARE_PROVIDER_SITE_OTHER): Payer: Medicaid Other | Admitting: Obstetrics and Gynecology

## 2021-08-31 ENCOUNTER — Encounter: Payer: Self-pay | Admitting: Obstetrics and Gynecology

## 2021-08-31 DIAGNOSIS — Z98891 History of uterine scar from previous surgery: Secondary | ICD-10-CM | POA: Insufficient documentation

## 2021-08-31 DIAGNOSIS — Z30017 Encounter for initial prescription of implantable subdermal contraceptive: Secondary | ICD-10-CM

## 2021-08-31 DIAGNOSIS — Z01812 Encounter for preprocedural laboratory examination: Secondary | ICD-10-CM | POA: Diagnosis not present

## 2021-08-31 DIAGNOSIS — Z8759 Personal history of other complications of pregnancy, childbirth and the puerperium: Secondary | ICD-10-CM | POA: Diagnosis not present

## 2021-08-31 LAB — POCT URINE PREGNANCY: Preg Test, Ur: NEGATIVE

## 2021-08-31 MED ORDER — ETONOGESTREL 68 MG ~~LOC~~ IMPL
68.0000 mg | DRUG_IMPLANT | Freq: Once | SUBCUTANEOUS | Status: AC
  Administered 2021-08-31: 68 mg via SUBCUTANEOUS

## 2021-08-31 NOTE — Progress Notes (Signed)
[facility] Partum Visit Note  Crystal Cannon is a 31 y.o. [phone] female who presents for a postpartum visit. She is several weeks postpartum following a primary cesarean section.  I have fully reviewed the prenatal and intrapartum course. The delivery was at [redacted]w[redacted]d gestational weeks.  Anesthesia: epidural. Postpartum course has been uncomplicated. Babies are doing well. Baby is feeding by bottle - Similac Neosure. Bleeding no bleeding. Bowel function is normal. Bladder function is normal. Patient is sexually active. Contraception method is none. Postpartum depression screening: negative.   She has had an episode of unprotected intercourse 2 days ago. She does not want any more children. She reports she is done. She was going to have a tubal but then she had a her c-section for twins at 77 weeks and felt like she should wait in the event something happened to one of the babies. She signed her papers on 9/26.   The pregnancy intention screening data noted above was reviewed. Potential methods of contraception were discussed. The patient elected to proceed with Nexplanon.   Edinburgh Postnatal Depression Scale - 08/31/21 1554       Edinburgh Postnatal Depression Scale:  In the Past 7 Days   I have been able to laugh and see the funny side of things. 0    I have looked forward with enjoyment to things. 0    I have blamed myself unnecessarily when things went wrong. 1    I have been anxious or worried for no good reason. 0    I have felt scared or panicky for no good reason. 0    Things have been getting on top of me. 0    I have been so unhappy that I have had difficulty sleeping. 1    I have felt sad or miserable. 0    I have been so unhappy that I have been crying. 1    The thought of harming myself has occurred to me. 0    Edinburgh Postnatal Depression Scale Total 3             Health Maintenance Due  Topic Date Due   COVID-19 Vaccine (1) Never done   TETANUS/TDAP  Never done    INFLUENZA VACCINE  03/22/2021    The following portions of the patient's history were reviewed and updated as appropriate: allergies, current medications, past family history, past medical history, past social history, past surgical history, and problem list.  Review of Systems Pertinent items are noted in HPI.  Objective:  BP 127/80 (BP Location: Right Arm, Patient Position: Sitting, Cuff Size: Large)    Pulse 84    Ht 5\' 8"  (1.727 m)    Wt 249 lb 6.4 oz (113.1 kg)    LMP 10/29/2020    Breastfeeding No    BMI 37.92 kg/m    General:  alert, cooperative, appears stated age, and no distress   Breasts:  not indicated  Lungs: clear to auscultation bilaterally  Heart:  regular rate and rhythm  Abdomen: soft, non-tender; bowel sounds normal; no masses,  no organomegaly   Wound well approximated incision  GU exam:  not indicated           GYNECOLOGY OFFICE PROCEDURE NOTE  Crystal Cannon is a 31 y.o. [id number] here for Nexplanon insertion.   Nexplanon Insertion Procedure Patient identified, informed consent performed, consent signed.   Patient does understand that irregular bleeding is a very common side effect of this medication. She was advised to  have backup contraception for one week after placement. Pregnancy test in clinic today was negative.  Appropriate time out taken.    Patient's left arm was prepped and draped in the usual sterile fashion. The ruler used to measure and mark insertion area.  Patient was prepped with alcohol swab and then injected with 3 ml of 1% lidocaine.  She was prepped with betadine, Nexplanon removed from packaging,  Device confirmed in needle, then inserted full length of needle and withdrawn per handbook instructions. Nexplanon was able to palpated in the patient's arm; patient palpated the insert herself. There was minimal blood loss.  Patient insertion site covered with guaze and a pressure bandage to reduce any bruising.    The patient tolerated the  procedure well and was given post procedure instructions.     Assessment:    There are no diagnoses linked to this encounter.  4 week postpartum exam.   Plan:   Essential components of care per ACOG recommendations:  1.  Mood and well being: Patient with negative depression screening today. Reviewed local resources for support.  - Patient tobacco use? Yes. Patient desires to quit? No.   - hx of drug use? No.    2. Infant care and feeding:  -Patient currently breastmilk feeding? No.  -Social determinants of health (SDOH) reviewed in EPIC. No concerns  3. Sexuality, contraception and birth spacing - Patient does not want a pregnancy in the next year.  Desired family size is 3 children.  - Reviewed forms of contraception in tiered fashion. Patient desired  Nexplanon  today.   - Discussed birth spacing of 18 months  4. Sleep and fatigue -Encouraged family/partner/community support of 4 hrs of uninterrupted sleep to help with mood and fatigue  5. Physical Recovery  - Discussed patients delivery and complications. She describes her labor as good. - Patient had a C-section.   - Patient has urinary incontinence? No. - Patient is safe to resume physical and sexual activity  6.  Health Maintenance - HM due items addressed Yes - Last pap smear  Diagnosis  Date Value Ref Range Status  01/20/2021   Final   - Negative for intraepithelial lesion or malignancy (NILM)   Pap smear not done at today's visit.  -Breast Cancer screening indicated? No.   7. Chronic Disease/Pregnancy Condition follow up:  GHTN - normotensive now off medication - PCP follow up  Radene Gunning, [place] [facility], [place]

## 2021-12-29 ENCOUNTER — Ambulatory Visit: Payer: Medicaid Other | Admitting: Obstetrics & Gynecology

## 2022-01-13 ENCOUNTER — Ambulatory Visit (INDEPENDENT_AMBULATORY_CARE_PROVIDER_SITE_OTHER): Payer: Medicaid Other | Admitting: Obstetrics & Gynecology

## 2022-01-13 ENCOUNTER — Encounter: Payer: Self-pay | Admitting: Obstetrics & Gynecology

## 2022-01-13 VITALS — BP 122/84 | HR 109 | Ht 68.0 in | Wt 271.0 lb

## 2022-01-13 DIAGNOSIS — Z3009 Encounter for other general counseling and advice on contraception: Secondary | ICD-10-CM

## 2022-01-13 NOTE — Progress Notes (Signed)
31 y.o GYN presents for BTS Consult.  She is currently on the Nexplanon.  BTL signed 01/13/22.

## 2022-01-13 NOTE — Progress Notes (Signed)
Patient ID: Crystal Cannon, female   DOB: 07-26-1991, 31 y.o.   MRN: [id number] Cc: patient desires sterilization. Patient has nexplanon in place since January but wishes to have LBTL for sterilization.  31 y.o. [id number] with undesired fertility desires permanent sterilization. Risks and benefits of laparoscopic tubal sterilization procedure was discussed with the patient including permanence of method, Filshie clips, bleeding, infection, injury to surrounding organs, anesthesia and need for additional procedures. Risk failure of 0.5-1% with increased risk of ectopic gestation if pregnancy occurs was also discussed with patient. Patient verbalized understanding and all questions were answered.   The risks of surgery were discussed in detail with the patient including but not limited to: bleeding which may require transfusion or reoperation; infection which may require prolonged hospitalization or re-hospitalization and antibiotic therapy; injury to bowel, bladder, ureters and major vessels or other surrounding organs which may lead to other procedures; formation of adhesions; need for additional procedures including laparotomy or subsequent procedures secondary to intraoperative injury or abnormal pathology; thromboembolic phenomenon; incisional problems and other postoperative or anesthesia complications.  Patient was told that the likelihood that her condition and symptoms will be treated effectively with this surgical management was very high; the postoperative expectations were also discussed in detail. The patient also understands the alternative treatment options which were discussed in full. All questions were answered.  She was told that she will be contacted by our surgical scheduler regarding the time and date of her surgery; routine preoperative instructions will be given to her by the preoperative nursing team. Printed patient education handouts about the procedure were given to the patient to review  at home. She signed Medicaid consent today  Currie Paris. Debroah Loop MD 01/13/2022

## 2022-03-30 ENCOUNTER — Encounter (HOSPITAL_BASED_OUTPATIENT_CLINIC_OR_DEPARTMENT_OTHER): Admission: RE | Payer: Self-pay | Source: Home / Self Care

## 2022-03-30 ENCOUNTER — Ambulatory Visit (HOSPITAL_BASED_OUTPATIENT_CLINIC_OR_DEPARTMENT_OTHER)
Admission: RE | Admit: 2022-03-30 | Payer: Medicaid Other | Source: Home / Self Care | Admitting: Obstetrics & Gynecology

## 2022-03-30 SURGERY — LIGATION, FALLOPIAN TUBE, LAPAROSCOPIC
Anesthesia: Choice | Laterality: Bilateral

## 2022-06-06 ENCOUNTER — Ambulatory Visit (HOSPITAL_COMMUNITY): Payer: Medicaid Other | Admitting: Student in an Organized Health Care Education/Training Program

## 2022-06-13 ENCOUNTER — Ambulatory Visit (HOSPITAL_COMMUNITY): Payer: Medicaid Other | Admitting: Student in an Organized Health Care Education/Training Program

## 2022-07-13 ENCOUNTER — Ambulatory Visit (HOSPITAL_COMMUNITY): Payer: Medicaid Other | Admitting: Psychiatry

## 2022-09-19 ENCOUNTER — Ambulatory Visit (HOSPITAL_COMMUNITY): Payer: Medicaid Other | Admitting: Psychiatry

## 2022-11-08 ENCOUNTER — Ambulatory Visit: Payer: Medicaid Other | Admitting: Obstetrics and Gynecology

## 2022-12-06 ENCOUNTER — Ambulatory Visit (INDEPENDENT_AMBULATORY_CARE_PROVIDER_SITE_OTHER): Payer: Medicaid Other | Admitting: Obstetrics and Gynecology

## 2022-12-06 VITALS — BP 119/83 | HR 78 | Ht 68.5 in | Wt 274.0 lb

## 2022-12-06 DIAGNOSIS — Z3046 Encounter for surveillance of implantable subdermal contraceptive: Secondary | ICD-10-CM | POA: Diagnosis not present

## 2022-12-06 IMAGING — US US OB COMP LESS 14 WK
1 series · 15 of 28 positions shown · non-contrast
Comparison: None.

CLINICAL DATA: Pregnancy.

EXAM:
TWIN OBSTETRICAL ULTRASOUND <14 WKS
TECHNIQUE: Transabdominal ultrasound was performed for evaluation of the
gestation as well as the maternal uterus and adnexal regions.

[Series 1: us ob comp less 14 wk · 34 acquisitions, 15 frames shown]
[im 1/34]
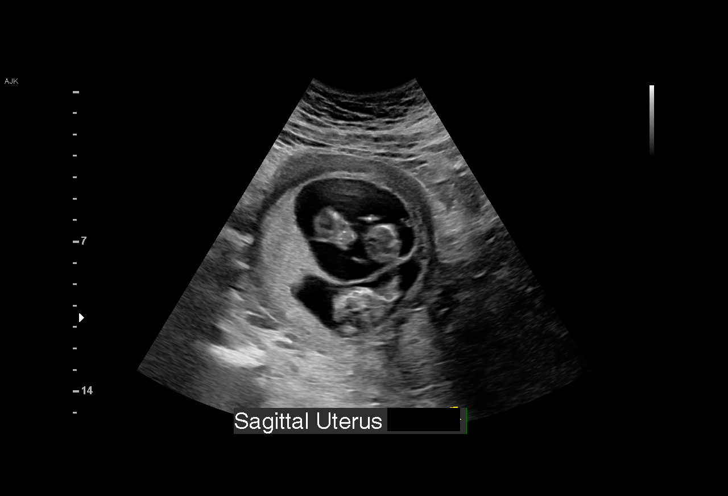
[im 3/34]
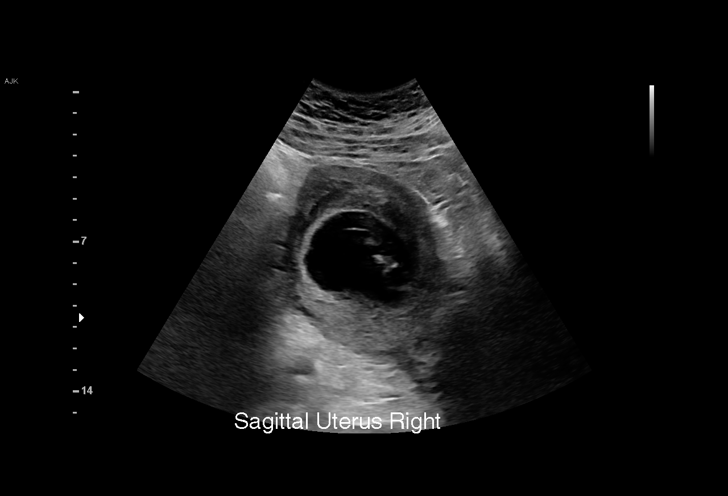
[im 5/34]
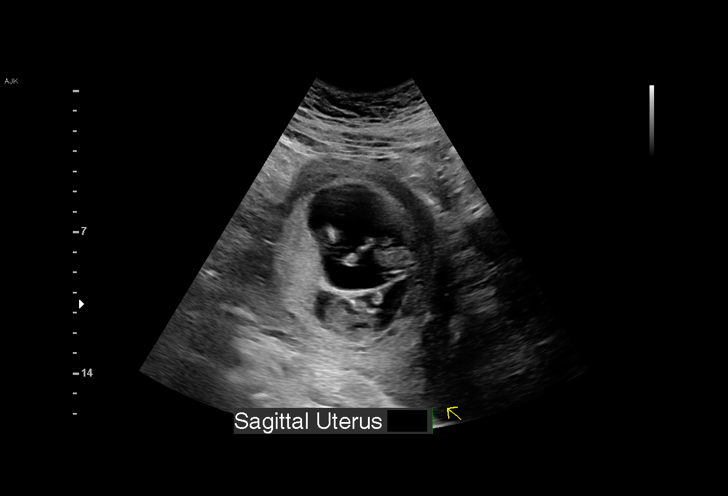
[im 8/34]
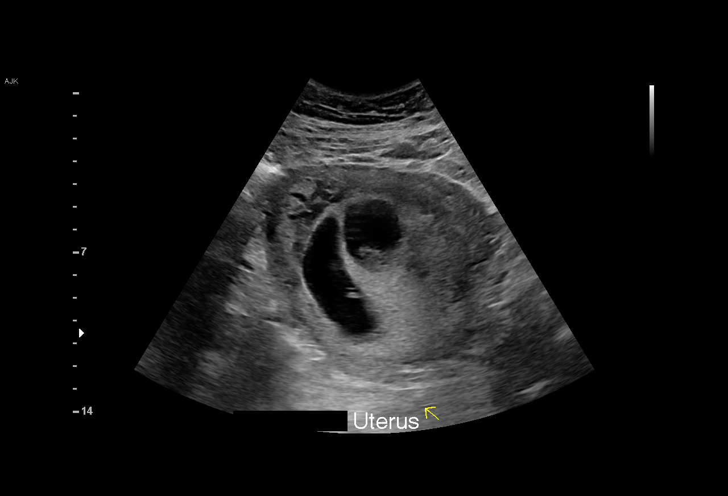
[im 10/34]
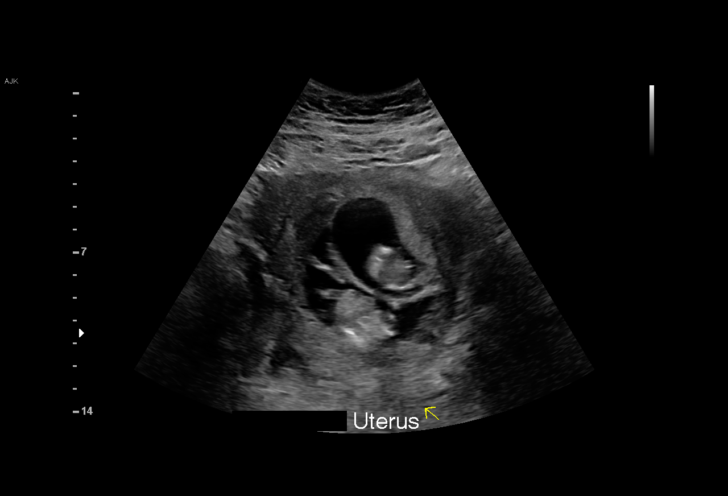
[im 13/34]
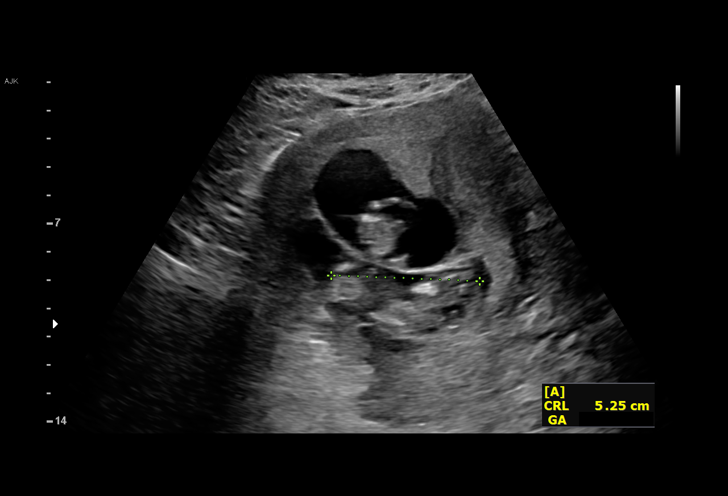
[im 15/34]
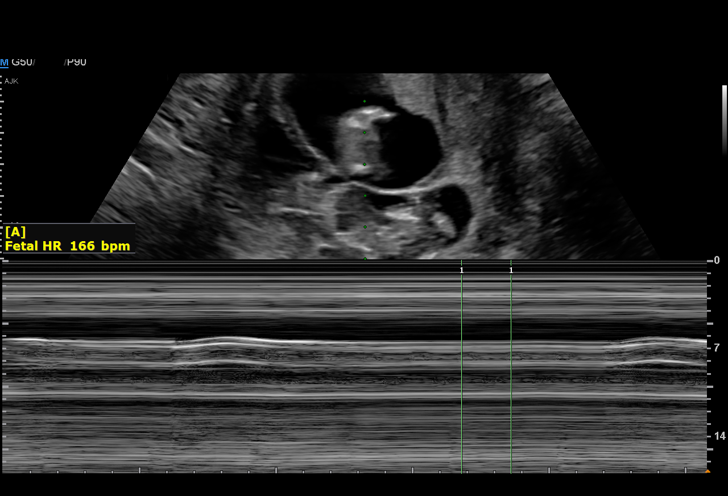
[im 18/34]
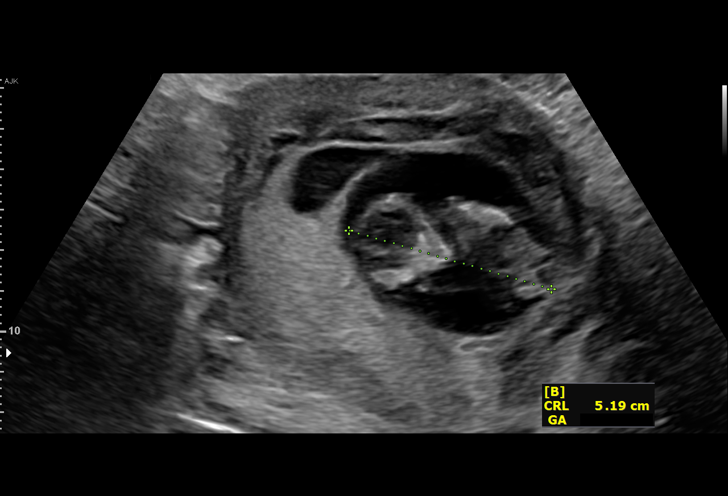
[im 19/34]
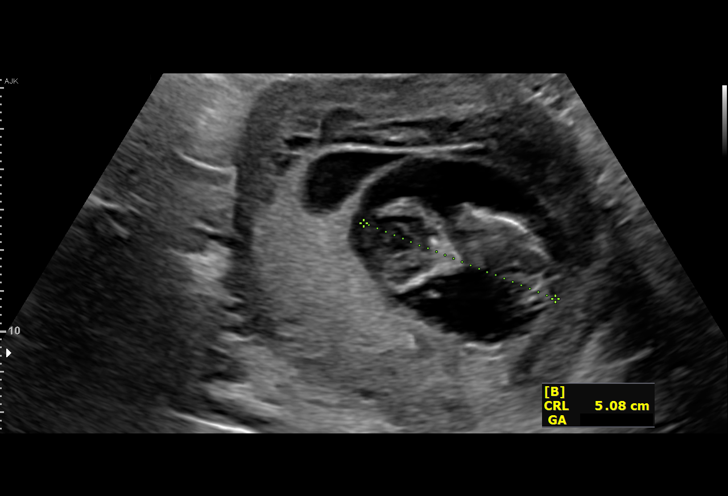
[im 21/34]
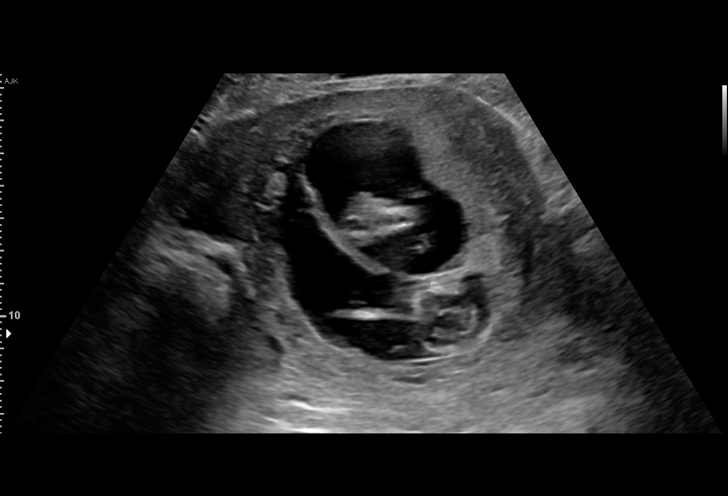
[im 24/34]
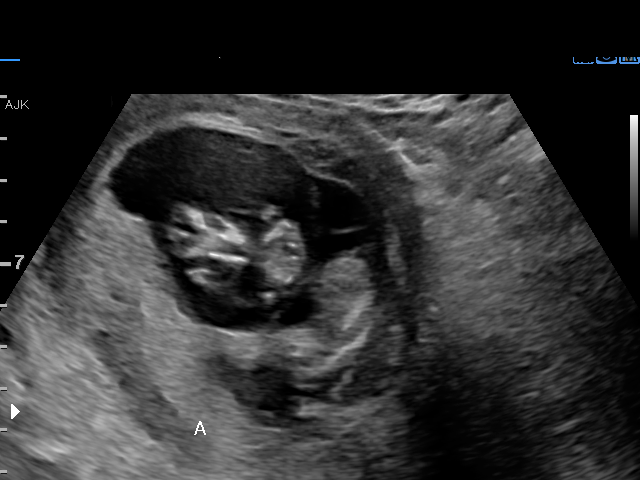
[im 26/34]
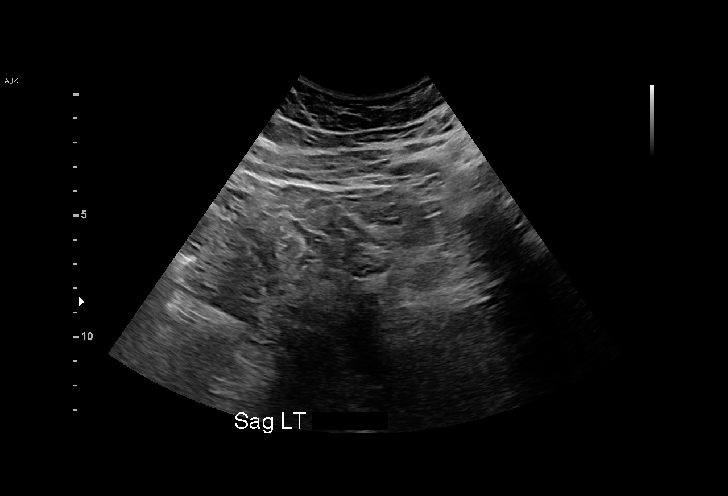
[im 29/34]
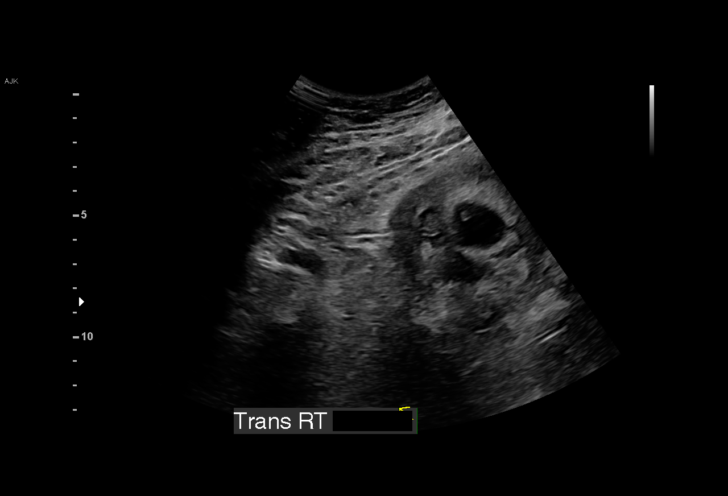
[im 31/34]
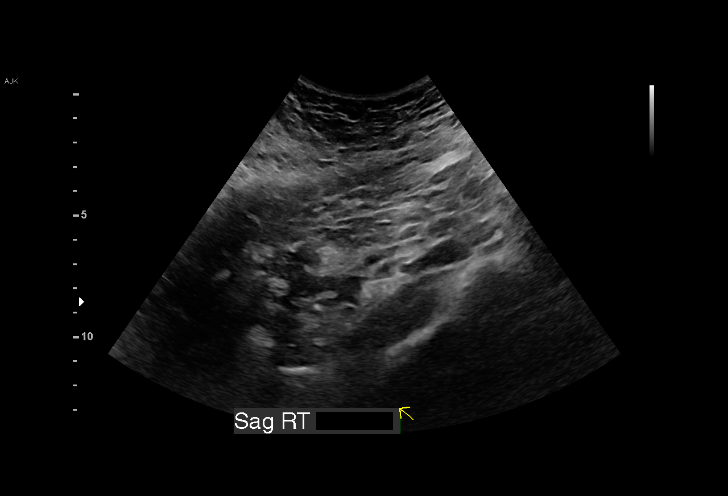
[im 34/34]
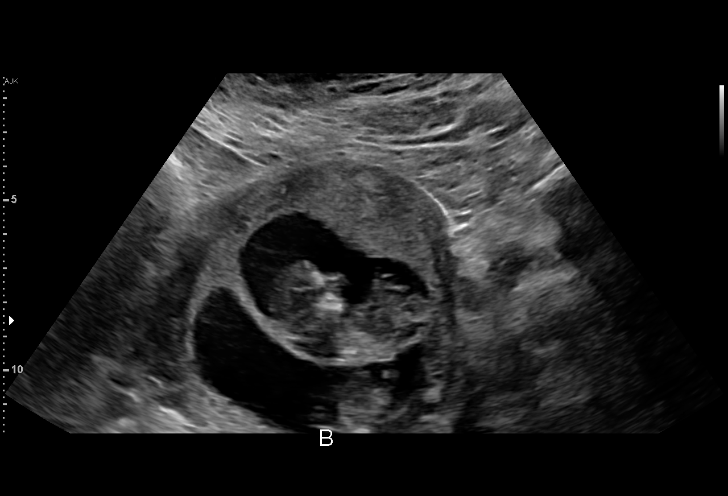

[15 of 28 positions shown; findings below may reference images not displayed]

FINDINGS: Number of IUPs:  2

Chorionicity/Amnionicity:  Dichorionic-diamniotic (thick membrane)

TWIN 1

Yolk sac:  Not Visualized.

Embryo:  Visualized.

Cardiac Activity: Visualized.

Heart Rate: 166 bpm

CRL:   54.5 mm   12 w 0 d                  US EDC: 08/03/2021

TWIN 2

Yolk sac:  Not Visualized.

Embryo:  Visualized.

Cardiac Activity: Visualized.

Heart Rate: 176 bpm

CRL:   50.5 mm   11 w 5 d                  US EDC: 08/05/2021

Subchorionic hemorrhage:  None visualized.

Maternal uterus/adnexae: Ovaries are not definitively seen. No free
fluid within the pelvis.
IMPRESSION: Live twin intrauterine pregnancy. Twin 1 measures 12 weeks 0 days by
crown-rump length. Twin 2 measures 11 weeks 5 days by crown-rump
length.

## 2022-12-06 NOTE — Progress Notes (Signed)
Request Nexplanon removal. Husband has now had a vasectomy.

## 2022-12-06 NOTE — Progress Notes (Signed)
     GYNECOLOGY OFFICE PROCEDURE NOTE  Crystal Cannon is a 32 y.o. [id number] here for Nexplanon removal.  Last pap smear was on 2023 and was normal.  No other gynecologic concerns.  Nexplanon Removal Patient identified, informed consent performed, consent signed.   Appropriate time out taken. Nexplanon site identified.  Area prepped in usual sterile fashon. One ml of 1% lidocaine was used to anesthetize the area at the distal end of the implant. A small stab incision was made right beside the implant on the distal portion.  The Nexplanon rod was grasped using hemostats and removed without difficulty.  There was minimal blood loss. There were no complications.  3 ml of 1% lidocaine was injected around the incision for post-procedure analgesia.  Steri-strips were applied over the small incision.  A pressure bandage was applied to reduce any bruising.  The patient tolerated the procedure well and was given post procedure instructions.  Patient is planning to use vasectomy for contraception.    [name], MD, FACOG Obstetrician & Gynecologist, [facility] for [facility], [facility]

## 2023-03-31 IMAGING — US US MFM OB FOLLOW-UP
1 series · 13 of 28 positions shown · non-contrast
Comparison: none

[Series 1: us mfm ob follow-up · 13 of 38 slices shown]
[im 2/38]
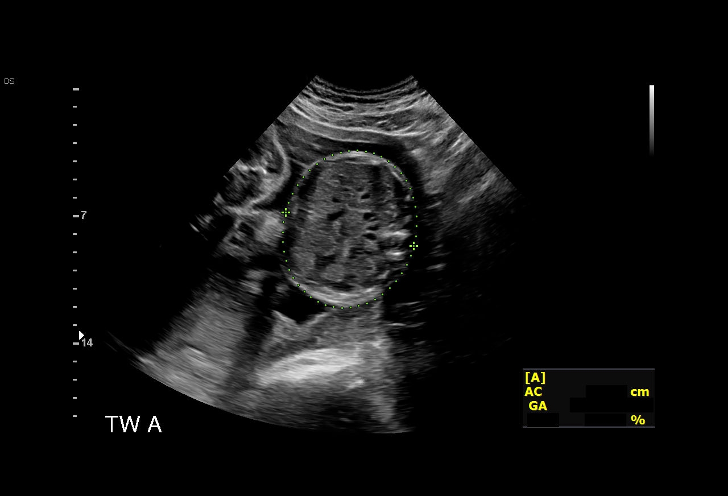
[im 5/38]
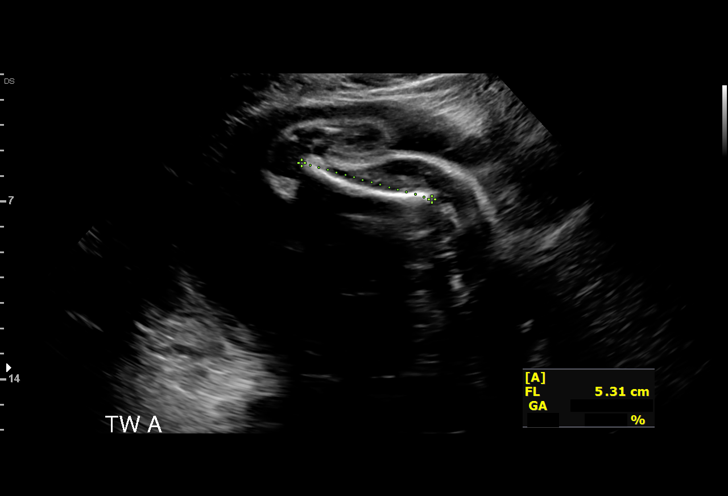
[im 7/38]
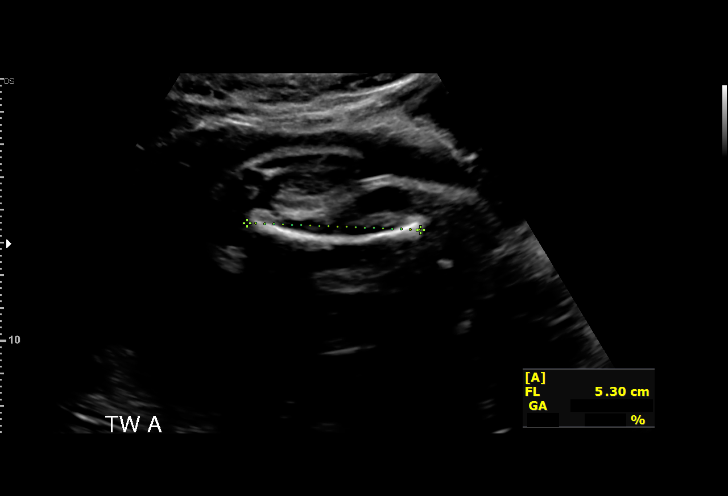
[im 10/38]
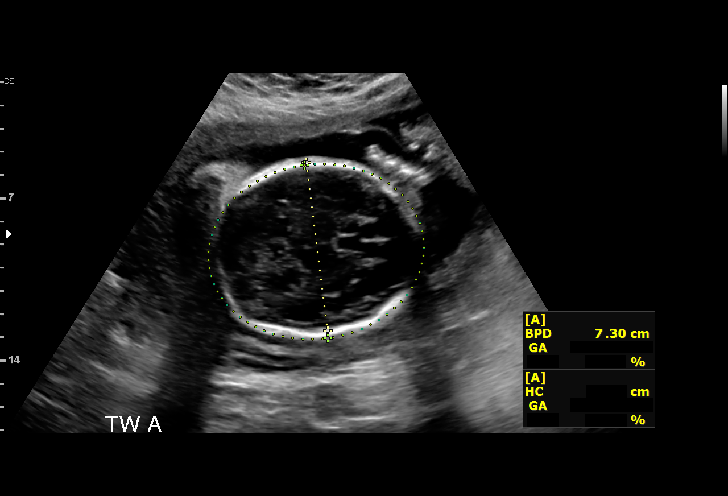
[im 13/38]
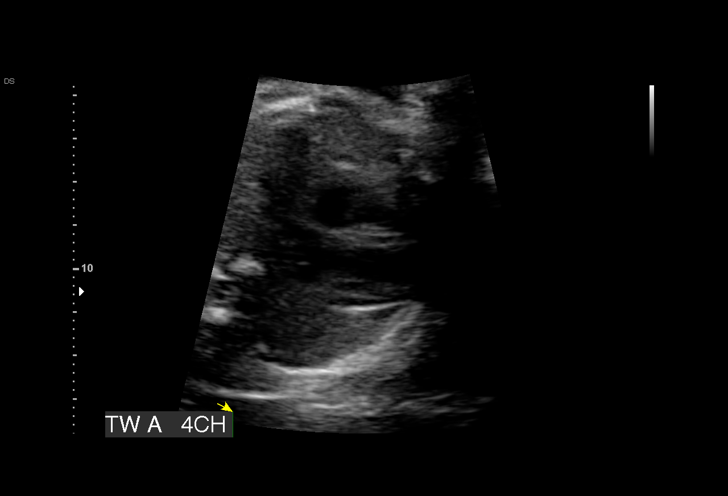
[im 16/38]
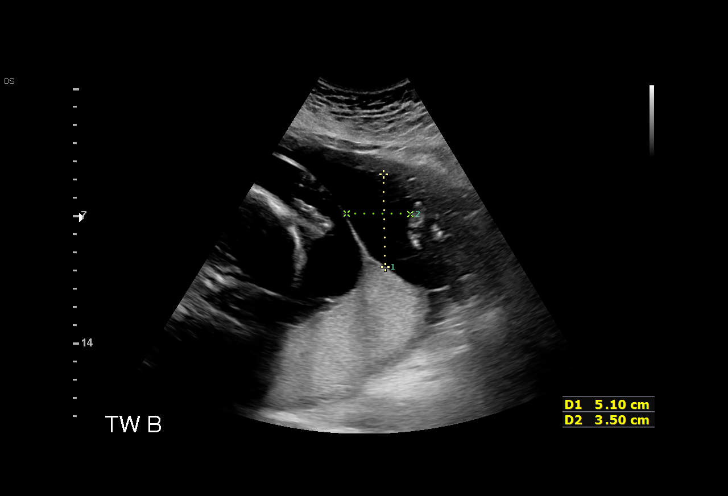
[im 20/38]
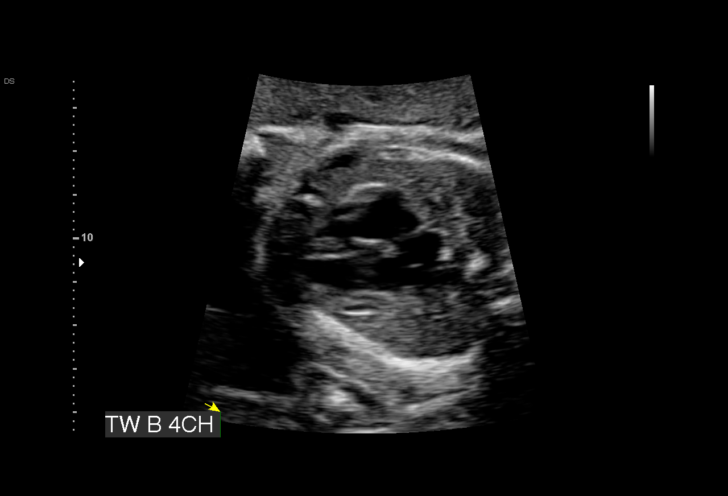
[im 22/38]
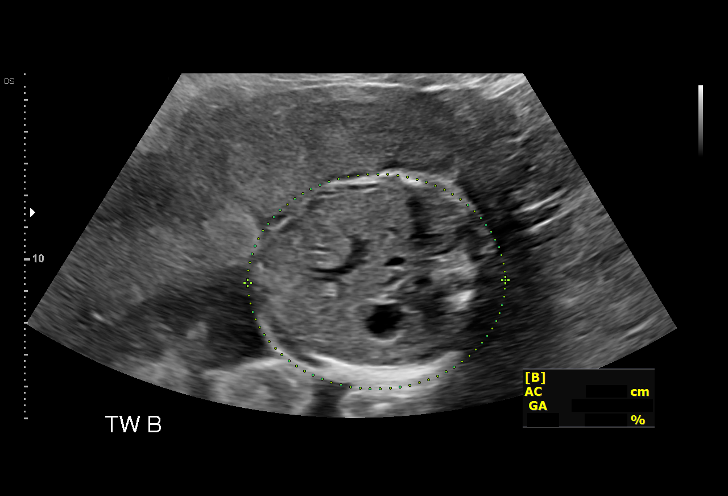
[im 25/38]
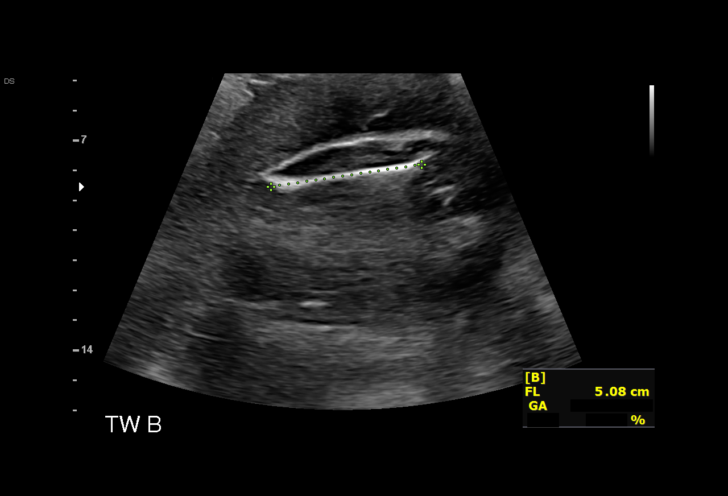
[im 28/38]
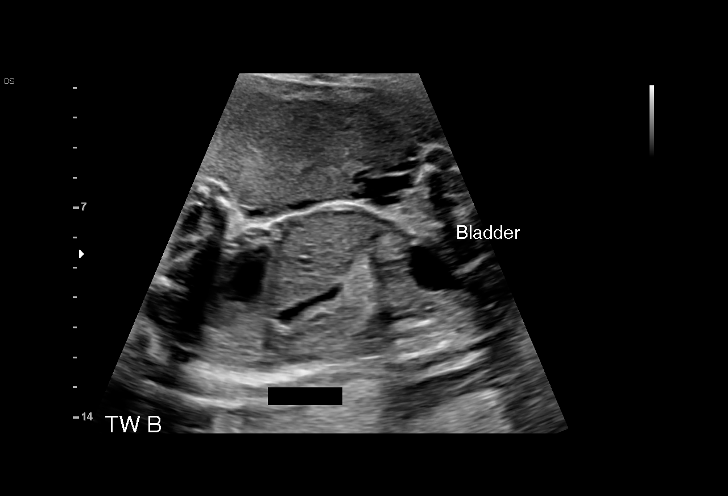
[im 31/38]
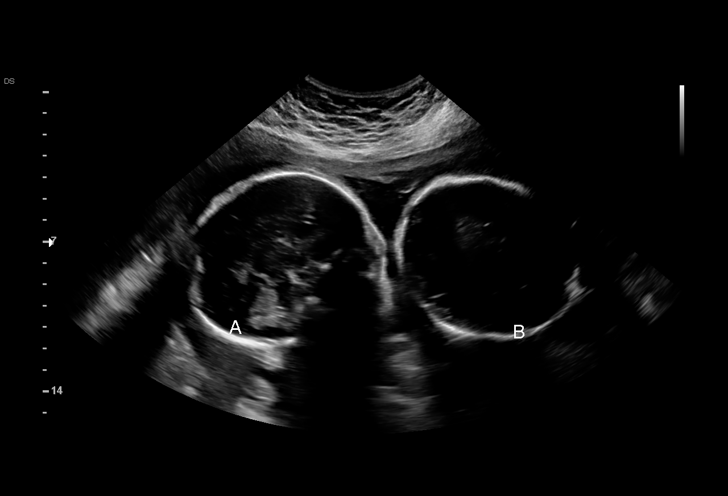
[im 33/38]
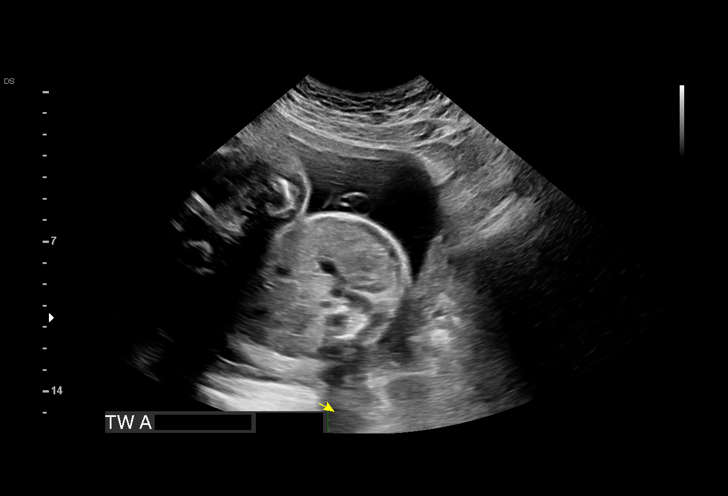
[im 36/38]
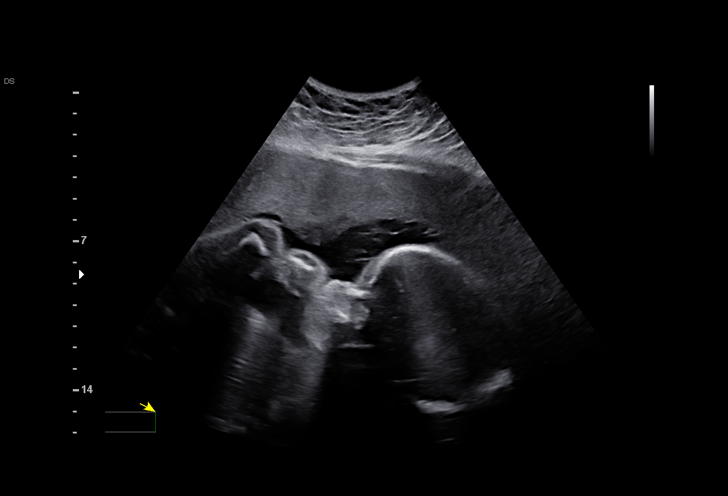

[13 of 28 positions shown; findings below may reference images not displayed]

JANKEN
    RUSLANPUBG                                              JANKEN

Indications

 Twin pregnancy, di/di, third trimester
 Obesity complicating pregnancy, third
 trimester
 28 weeks gestation of pregnancy
Fetal Evaluation (Fetus A)

 Num Of Fetuses:         2
 Cardiac Activity:       Observed
 Fetal Lie:              Lower Fetus on Maternal right
 Presentation:           Breech
 Placenta:               Posterior
 P. Cord Insertion:      Previously seen as ventral
 Membrane Desc:      Dividing Membrane seen - Dichorionic.

 Amniotic Fluid
 AFI FV:      Within normal limits

 Comment:    3.53 x 3.96 cm.
Biometry (Fetus A)
 BPD:      73.1  mm     G. Age:  29w 2d         76  %    CI:        75.34   %    70 - 86
                                                         FL/HC:      19.7   %    18.8 -
 HC:      267.1  mm     G. Age:  29w 1d         49  %    HC/AC:      1.05        1.05 -
 AC:      254.6  mm     G. Age:  29w 5d         84  %    FL/BPD:     72.1   %    71 - 87
 FL:       52.7  mm     G. Age:  28w 0d         32  %    FL/AC:      20.7   %    20 - 24
 LV:        5.3  mm

 Est. FW:    5516  gm    2 lb 15 oz      72  %     FW Discordancy     0 \ 21 %
OB History

 Gravidity:    4         Term:   1         SAB:   2
 Living:       1
Gestational Age (Fetus A)

 LMP:           28w 1d        Date:  10/29/20                 EDD:   08/05/21
 U/S Today:     29w 0d                                        EDD:   07/30/21
 Best:          28w 1d     Det. By:  LMP  (10/29/20)          EDD:   08/05/21
Anatomy (Fetus A)

 Cranium:               Appears normal         Stomach:                Appears normal, left
                                                                       sided
 Cavum:                 Appears normal         Kidneys:                Appear normal
 Ventricles:            Appears normal         Bladder:                Appears normal
 Heart:                 Appears normal
                        (4CH, axis, and
                        situs)

Fetal Evaluation (Fetus B)

 Num Of Fetuses:         2
 Cardiac Activity:       Observed
 Fetal Lie:              Upper Fetus
 Presentation:           Transverse, head to maternal right
 Placenta:               Anterior
 P. Cord Insertion:      Visualized, central
 Membrane Desc:      Dividing Membrane seen - Dichorionic.

 Amniotic Fluid
 AFI FV:      Within normal limits

 Comment:    5.1 x 3.5 cm.
Biometry (Fetus B)

 BPD:      66.5  mm     G. Age:  26w 6d          7  %    CI:        73.27   %    70 - 86
                                                         FL/HC:      20.5   %    18.8 -
 HC:      246.9  mm     G. Age:  26w 6d          2  %    HC/AC:      1.07        1.05 -
 AC:       230   mm     G. Age:  27w 3d         20  %    FL/BPD:     76.1   %    71 - 87
 FL:       50.6  mm     G. Age:  27w 1d         12  %    FL/AC:      22.0   %    20 - 24

 Est. FW:    9599  gm      2 lb 5 oz     11  %     FW Discordancy        21  %
Gestational Age (Fetus B)
 LMP:           28w 1d        Date:  10/29/20                 EDD:   08/05/21
 U/S Today:     27w 1d                                        EDD:   08/12/21
 Best:          28w 1d     Det. By:  LMP  (10/29/20)          EDD:   08/05/21
Anatomy (Fetus B)

 Cranium:               Appears normal         Stomach:                Appears normal, left
                                                                       sided
 Cavum:                 Appears normal         Kidneys:                Appear normal
 Ventricles:            Appears normal         Bladder:                Appears normal
 Heart:                 Appears normal
                        (4CH, axis, and
                        situs)
Comments

 This patient was seen for a follow up growth scan due to a
 dizygotic (based on her cell free DNA test), dichorionic,
 diamniotic twin gestation where growth discordance has been
 noted between twin A and twin B during her prior ultrasound
 exams.  She denies any problems since her last exam and
 reports feeling vigorous fetal movements of both fetuses
 throughout the day.
 She was informed that the fetal growth and amniotic fluid
 level appears appropriate for her gestational age for both twin
 A and twin B.  A growth discordance of 21% continues to be
 noted between the two fetuses.
 The increased risk of an adverse pregnancy outcome when
 the growth discordance is greater than 20% in a twin
 pregnancy was discussed.
 Due to the growth discordance, we will continue to follow her
 with weekly NSTs.
 She had a reactive NST for both fetuses today.
 She will return in 1 week for another NST.  We will reassess
 the fetal growth again in 3 weeks.

## 2023-04-21 IMAGING — US US MFM OB FOLLOW-UP
1 series · 16 of 28 positions shown · non-contrast
Comparison: none

[Series 1: us mfm ob follow-up · 16 of 115 slices shown]
[im 1/115]
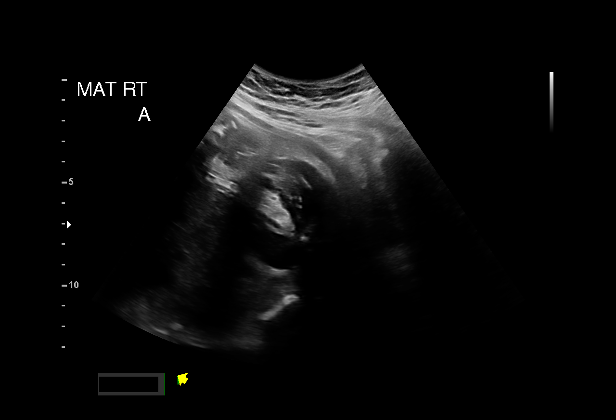
[im 9/115]
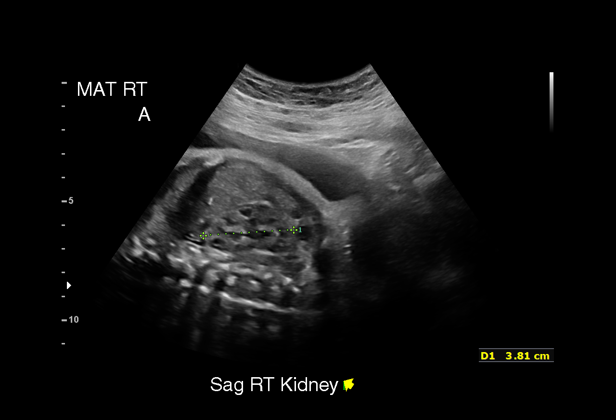
[im 17/115]
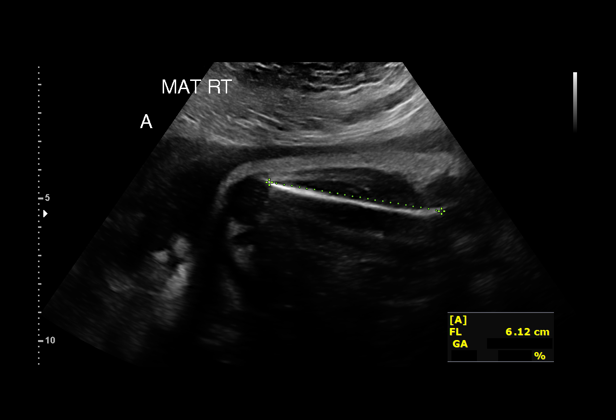
[im 26/115]
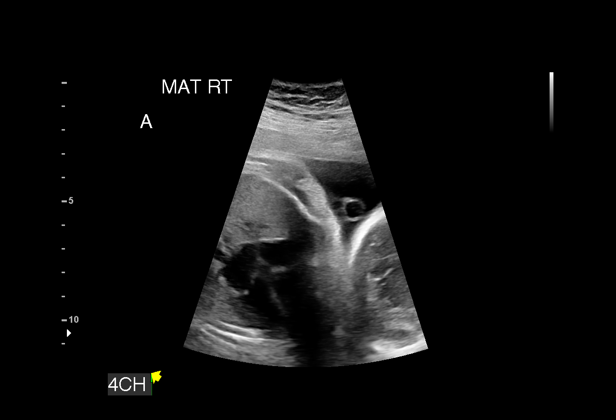
[im 30/115]
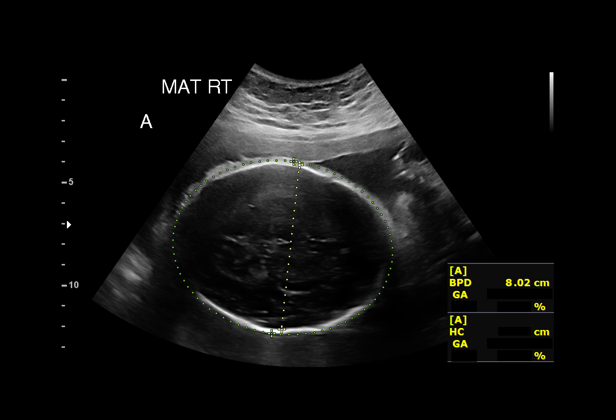
[im 39/115]
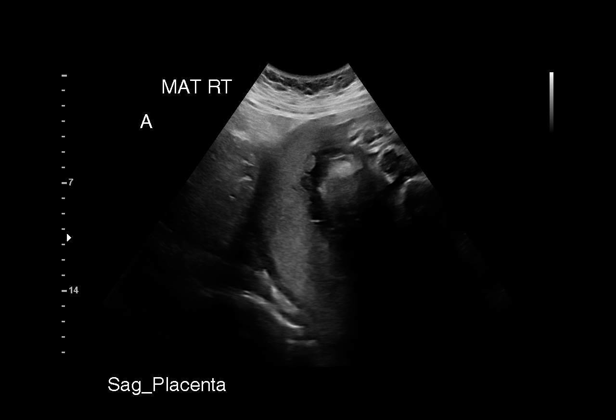
[im 47/115]
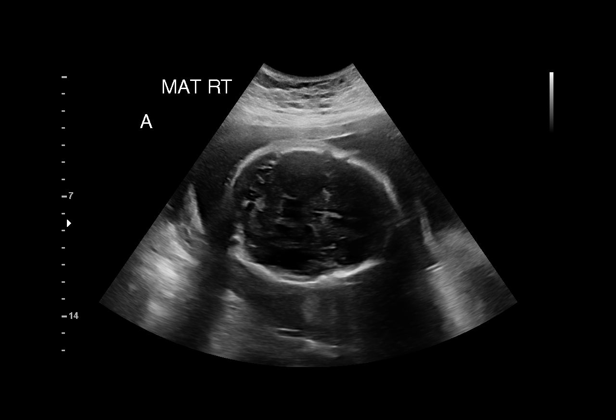
[im 55/115]
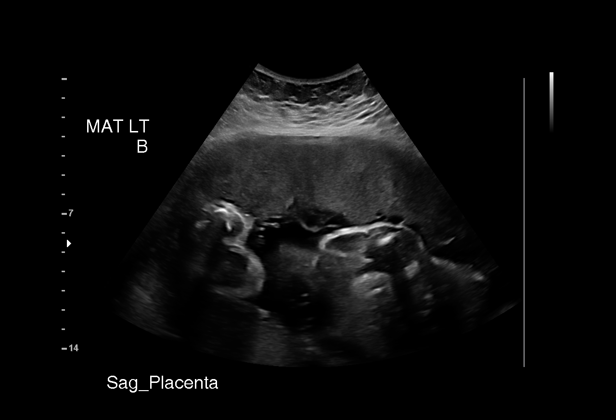
[im 60/115]
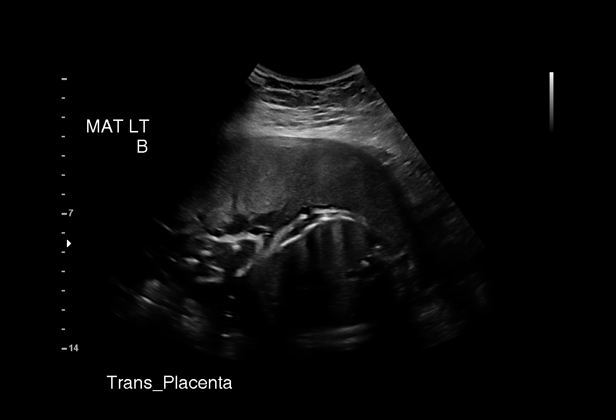
[im 68/115]
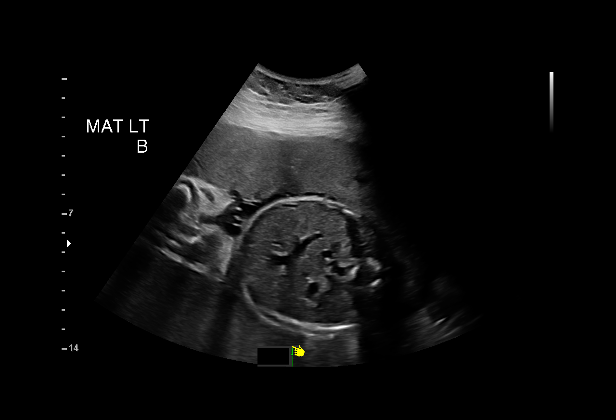
[im 77/115]
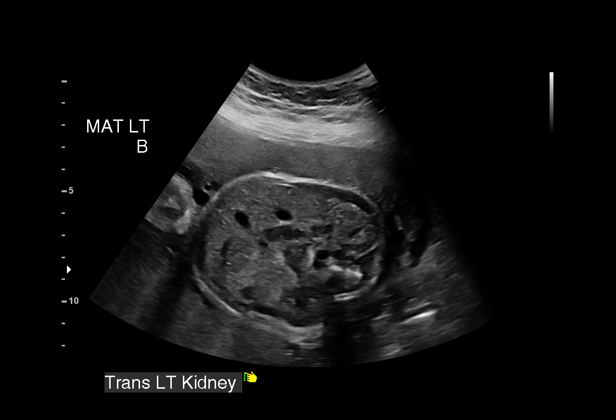
[im 85/115]
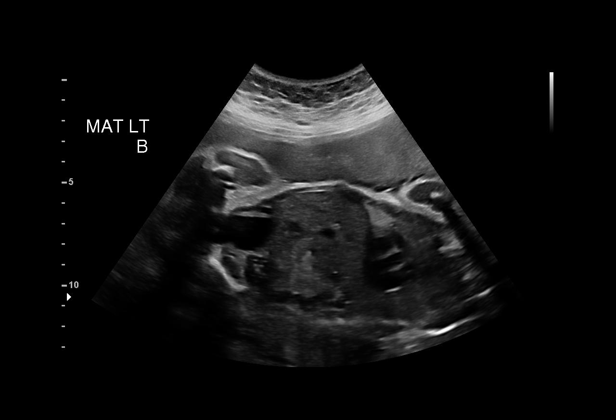
[im 89/115]
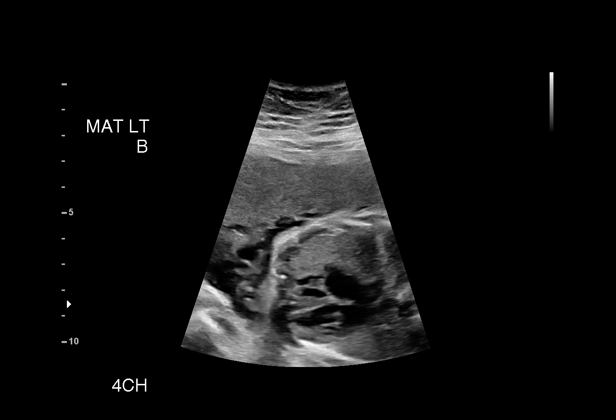
[im 98/115]
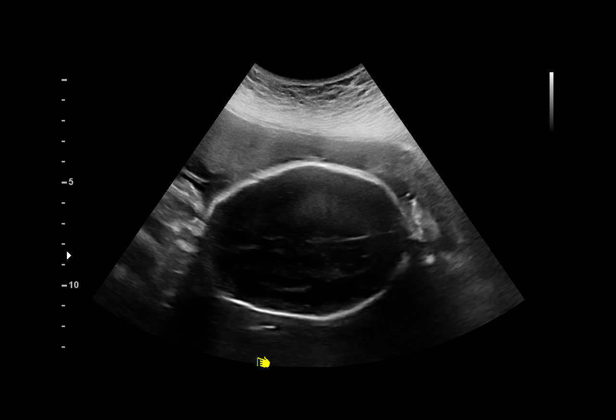
[im 106/115]
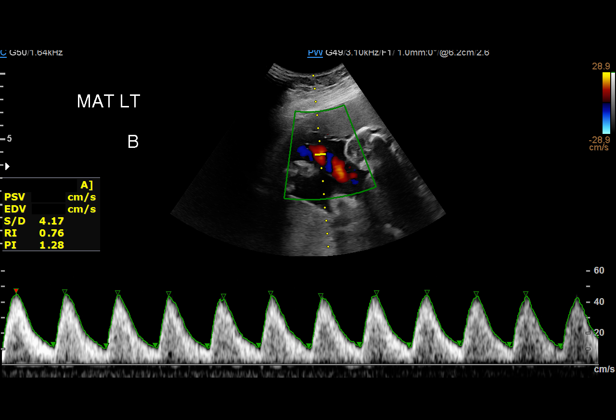
[im 115/115]
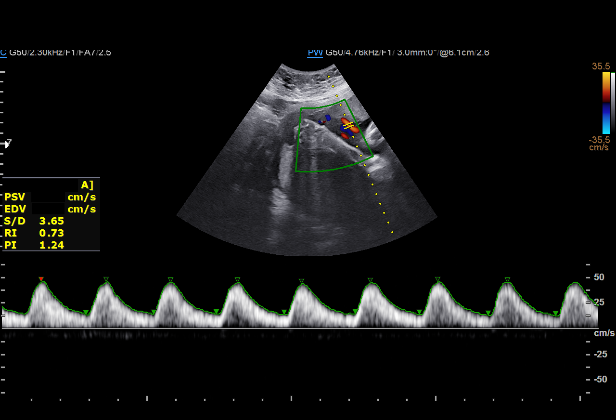

[16 of 28 positions shown; findings below may reference images not displayed]

GEST

Indications

 Maternal care for known or suspected poor
 fetal growth, third trimester, fetus 2 IUGR
 Twin pregnancy, di/di, third trimester
 Obesity complicating pregnancy, third
 trimester
 31 weeks gestation of pregnancy
Fetal Evaluation (Fetus A)

 Num Of Fetuses:          2
 Fetal Heart Rate(bpm):   140
 Cardiac Activity:        Observed
 Fetal Lie:               Lower Fetus;  Maternal right
 Presentation:            Breech
 Placenta:                Posterior
 P. Cord Insertion:       Previously Visualized
 Membrane Desc:      Dividing Membrane seen - Dichorionic.
 Amniotic Fluid
 AFI FV:      Within normal limits

                             Largest Pocket(cm)

Biometry (Fetus A)

 BPD:      80.8  mm     G. Age:  32w 3d         78  %    CI:        71.86   %    70 - 86
                                                         FL/HC:       20.3  %    19.3 -
 HC:      303.4  mm     G. Age:  33w 5d         82  %    HC/AC:       1.00       0.96 -
 AC:      303.9  mm     G. Age:  34w 2d       > 99  %    FL/BPD:      76.4  %    71 - 87
 FL:       61.7  mm     G. Age:  32w 0d         60  %    FL/AC:       20.3  %    20 - 24
 LV:        5.5  mm

 Est. FW:    9425   gm   4 lb 13 oz      97  %     FW Discordancy     0 \ 35 %
OB History

 Gravidity:    4         Term:   1         SAB:   2
 Living:       1
Gestational Age (Fetus A)

 LMP:           31w 1d        Date:  10/29/20                 EDD:   08/05/21
 U/S Today:     33w 1d                                        EDD:   07/22/21
 Best:          31w 1d     Det. By:  LMP  (10/29/20)          EDD:   08/05/21
Anatomy (Fetus A)

 Cranium:               Appears normal         Aortic Arch:            Not well visualized
 Cavum:                 Appears normal         Ductal Arch:            Previously seen
 Ventricles:            Appears normal         Diaphragm:              Previously seen
 Choroid Plexus:        Previously seen        Stomach:                Appears normal, left
                                                                       sided
 Cerebellum:            Previously seen        Abdomen:                Previously seen
 Posterior Fossa:       Previously seen        Abdominal Wall:         Previously seen
 Nuchal Fold:           Previously seen        Cord Vessels:           Previously seen
 Face:                  Profile appears        Kidneys:                Appear normal
                        normal
 Lips:                  Previously seen        Bladder:                Appears normal
 Thoracic:              Previously seen        Spine:                  Not well visualized
 Heart:                 Appears normal         Upper Extremities:      Previously seen
                        (4CH, axis, and
                        situs)
 RVOT:                  Previously seen        Lower Extremities:      Previously seen
 LVOT:                  Previously seen

 Other:  Heels and 5th digit previously visualized. VC, 2LLpreviously
         visualized. 3VT visualized. Fetus appears to be a male.
Doppler - Fetal Vessels (Fetus A)

 Umbilical Artery
   S/D    %tile      RI    %tile                             ADFV    RDFV
  4.67   > 97.5    0.79   > 97.5                                No      No
Fetal Evaluation (Fetus B)

 Num Of Fetuses:          2
 Fetal Heart Rate(bpm):   130
 Cardiac Activity:        Observed
 Fetal Lie:               Upper Fetus
 Presentation:            Cephalic
 Placenta:                Anterior
 P. Cord Insertion:       Previously Visualized
 Membrane Desc:      Dividing Membrane seen - Dichorionic.

 Amniotic Fluid
 AFI FV:      Within normal limits

                             Largest Pocket(cm)

Biometry (Fetus B)

 BPD:      75.7  mm     G. Age:  30w 3d         18  %    CI:        72.36   %    70 - 86
                                                         FL/HC:       19.2  %    19.3 -
 HC:      283.1  mm     G. Age:  31w 0d         14  %    HC/AC:       1.10       0.96 -
 AC:      256.4  mm     G. Age:  29w 6d         13  %    FL/BPD:      71.7  %    71 - 87
 FL:       54.3  mm     G. Age:  28w 5d        1.4  %    FL/AC:       21.2  %    20 - 24

 LV:          4  mm

 Est. FW:    6182   gm     3 lb 2 oz      6  %     FW Discordancy        35  %
Gestational Age (Fetus B)

 LMP:           31w 1d        Date:  10/29/20                 EDD:   08/05/21
 U/S Today:     30w 0d                                        EDD:   08/13/21
 Best:          31w 1d     Det. By:  LMP  (10/29/20)          EDD:   08/05/21
Anatomy (Fetus B)

 Cranium:               Appears normal         Aortic Arch:            Appears normal
 Cavum:                 Appears normal         Ductal Arch:            Appears normal
 Ventricles:            Appears normal         Diaphragm:              Previously seen
 Choroid Plexus:        Previously seen        Stomach:                Appears normal, left
                                                                       sided
 Cerebellum:            Previously seen        Abdomen:                Previously seen
 Posterior Fossa:       Previously seen        Abdominal Wall:         Previously seen
 Nuchal Fold:           Previously seen        Cord Vessels:           Previously seen
 Face:                  Profile appears        Kidneys:                Appear normal
                        normal
 Lips:                  Previously seen        Bladder:                Appears normal
 Thoracic:              Previously seen        Spine:                  Not well visualized
 Heart:                 Previously seen        Upper Extremities:      Previously seen
 RVOT:                  Previously seen        Lower Extremities:      Previously seen
 LVOT:                  Previously seen

 Other:  Heels and 5th digit previously visualized. VC, 3VV and 3VTV
         previously visualized.
Doppler - Fetal Vessels (Fetus B)
 Umbilical Artery
   S/D    %tile      RI    %tile      PI    %tile            ADFV    RDFV
  5.65   > 97.5    0.81   > 97.5    1.49   > 97.5               No      No

Cervix Uterus Adnexa

 Cervix
 Not visualized (advanced GA >89wks)
Comments

 This patient was seen for a follow up growth scan as a growth
 discordance was noted between the two fetuses in a
 dichorionic twin gestation.  She denies any problems since
 her last exam and reports feeling vigorous fetal movements
 of both fetuses throughout the day.
 Twin A: EFW 4 pounds 13 ounces (97th percentile for her
 gestational age). Normal amniotic fluid.  Doppler studies of
 the umbilical arteries showed continued normal forward flow.
 There were no signs of absent or reversed end-diastolic flow
 noted.
 Twin B: EFW 3 pounds 2 ounce (6th percentile for her
 gestational age).  Normal amniotic fluid.  Doppler studies of
 the umbilical arteries showed continued normal forward flow.
 There were no signs of absent or reversed end-diastolic flow
 noted.
 She had an NST that was reactive for her gestational age for
 both twin A and twin B.
 Due to fetal growth restriction, we will continue to follow her
 with weekly fetal testing and umbilical artery Doppler studies.
 Another NST and umbilical artery Doppler study was
 scheduled in 1 week.

 Due to fetal growth restriction in a dichorionic twin gestation,
 delivery is recommended at around 36 weeks.

## 2024-07-10 ENCOUNTER — Ambulatory Visit: Admitting: Family Medicine

## 2024-07-10 ENCOUNTER — Encounter: Payer: Self-pay | Admitting: Family Medicine

## 2024-07-10 VITALS — BP 129/83 | HR 89 | Ht 68.5 in | Wt 261.2 lb

## 2024-07-10 DIAGNOSIS — Z6839 Body mass index (BMI) 39.0-39.9, adult: Secondary | ICD-10-CM

## 2024-07-10 DIAGNOSIS — Z7689 Persons encountering health services in other specified circumstances: Secondary | ICD-10-CM

## 2024-07-10 DIAGNOSIS — E66812 Obesity, class 2: Secondary | ICD-10-CM | POA: Diagnosis not present

## 2024-07-10 DIAGNOSIS — F411 Generalized anxiety disorder: Secondary | ICD-10-CM

## 2024-07-10 DIAGNOSIS — J4520 Mild intermittent asthma, uncomplicated: Secondary | ICD-10-CM | POA: Diagnosis not present

## 2024-07-10 DIAGNOSIS — E6609 Other obesity due to excess calories: Secondary | ICD-10-CM

## 2024-07-10 DIAGNOSIS — E282 Polycystic ovarian syndrome: Secondary | ICD-10-CM

## 2024-07-10 MED ORDER — SPIRONOLACTONE 25 MG PO TABS: 25.0000 mg | ORAL_TABLET | Freq: Every day | ORAL | 0 refills | Status: AC

## 2024-07-10 MED ORDER — ALBUTEROL SULFATE HFA 108 (90 BASE) MCG/ACT IN AERS: 2.0000 | INHALATION_SPRAY | RESPIRATORY_TRACT | 1 refills | Status: AC | PRN

## 2024-07-10 MED ORDER — SERTRALINE HCL 50 MG PO TABS: 50.0000 mg | ORAL_TABLET | Freq: Every day | ORAL | 1 refills | Status: DC

## 2024-07-10 NOTE — Progress Notes (Unsigned)
   Established Patient Office Visit  Subjective    Patient ID: Crystal Cannon, female    DOB: May 16, 1991  Age: 33 y.o. MRN: [id number]  CC:  Chief Complaint  Patient presents with   New Patient (Initial Visit)    Pt would like to discuss hirutism / pcos .  She would also like to discuss weight and getting back on an anxiety medication    HPI Crystal Cannon presents ***  Outpatient Encounter Medications as of 07/10/2024  Medication Sig   albuterol (VENTOLIN HFA) 108 (90 Base) MCG/ACT inhaler Inhale 2 puffs into the lungs every 4 (four) hours as needed.   ibuprofen  (ADVIL ) 600 MG tablet Take 1 tablet (600 mg total) by mouth every 6 (six) hours. (Patient not taking: Reported on 07/10/2024)   No facility-administered encounter medications on file as of 07/10/2024.    History reviewed. No pertinent past medical history.  Past Surgical History:  Procedure Laterality Date   CESAREAN SECTION MULTI-GESTATIONAL N/A 06/15/2021   Procedure: CESAREAN SECTION MULTI-GESTATIONAL;  Surgeon: Alger Gong, MD;  Location: [facility] LD ORS;  Service: Obstetrics;  Laterality: N/A;    History reviewed. No pertinent family history.  Social History   Socioeconomic History   Marital status: Single    Spouse name: Not on file   Number of children: Not on file   Years of education: Not on file   Highest education level: Not on file  Occupational History   Not on file  Tobacco Use   Smoking status: Former    Current packs/day: 0.00    Types: Cigarettes    Quit date: 2015    Years since quitting: 10.8   Smokeless tobacco: Never  Vaping Use   Vaping status: Never Used  Substance and Sexual Activity   Alcohol use: No   Drug use: No   Sexual activity: Yes    Birth control/protection: Implant  Other Topics Concern   Not on file  Social History Narrative   Not on file   Social Drivers of Health   Financial Resource Strain: Not on file  Food Insecurity: Not on file  Transportation Needs:  Not on file  Physical Activity: Insufficiently Active (07/10/2024)   Exercise Vital Sign    Days of Exercise per Week: 3 days    Minutes of Exercise per Session: 40 min  Stress: Not on file  Social Connections: Socially Isolated (07/10/2024)   Social Connection and Isolation Panel    Frequency of Communication with Friends and Family: Once a week    Frequency of Social Gatherings with Friends and Family: Once a week    Attends Religious Services: Never    Database Administrator or Organizations: No    Attends Banker Meetings: Never    Marital Status: Never married  Catering Manager Violence: Not on file    Review of Systems  Psychiatric/Behavioral:  Positive for depression. Negative for substance abuse and suicidal ideas. The patient is nervous/anxious.   All other systems reviewed and are negative.       Objective    BP 129/83   Pulse 89   Ht 5' 8.5 (1.74 m)   Wt 261 lb 3.2 oz (118.5 kg)   LMP 06/26/2024 (Approximate)   SpO2 93%   BMI 39.14 kg/m   Physical Exam  {Labs (Optional):23779}    Assessment & Plan:   There are no diagnoses linked to this encounter.   No follow-ups on file.   Tanda Raguel SQUIBB, MD

## 2024-07-11 ENCOUNTER — Encounter: Payer: Self-pay | Admitting: Family Medicine

## 2024-07-11 NOTE — Progress Notes (Signed)
 New Patient Office Visit  Subjective    Patient ID: Crystal Cannon, female    DOB: Feb 10, 1991  Age: 33 y.o. MRN: [id number]  CC:  Chief Complaint  Patient presents with   New Patient (Initial Visit)    Pt would like to discuss hirutism / pcos .  She would also like to discuss weight and getting back on an anxiety medication    HPI Crystal Cannon presents to establish care. Patient reports that she has PCOS/hirsutism. She is most concerned about the hirsutism because she already has 3 children and is not interested in any more. She also would like to get back on meds for depression/anxiety and is concerned about her weight.    Outpatient Encounter Medications as of 07/10/2024  Medication Sig   sertraline (ZOLOFT) 50 MG tablet Take 1 tablet (50 mg total) by mouth daily.   spironolactone (ALDACTONE) 25 MG tablet Take 1 tablet (25 mg total) by mouth daily.   albuterol (VENTOLIN HFA) 108 (90 Base) MCG/ACT inhaler Inhale 2 puffs into the lungs every 4 (four) hours as needed.   ibuprofen  (ADVIL ) 600 MG tablet Take 1 tablet (600 mg total) by mouth every 6 (six) hours. (Patient not taking: Reported on 07/10/2024)   [DISCONTINUED] albuterol (VENTOLIN HFA) 108 (90 Base) MCG/ACT inhaler Inhale 2 puffs into the lungs every 4 (four) hours as needed.   No facility-administered encounter medications on file as of 07/10/2024.    History reviewed. No pertinent past medical history.  Past Surgical History:  Procedure Laterality Date   CESAREAN SECTION MULTI-GESTATIONAL N/A 06/15/2021   Procedure: CESAREAN SECTION MULTI-GESTATIONAL;  Surgeon: Alger Gong, MD;  Location: [facility];  Service: Obstetrics;  Laterality: N/A;    History reviewed. No pertinent family history.  Social History   Socioeconomic History   Marital status: Single    Spouse name: Not on file   Number of children: Not on file   Years of education: Not on file   Highest education level: Not on file  Occupational  History   Not on file  Tobacco Use   Smoking status: Former    Current packs/day: 0.00    Types: Cigarettes    Quit date: 2015    Years since quitting: 10.8   Smokeless tobacco: Never  Vaping Use   Vaping status: Never Used  Substance and Sexual Activity   Alcohol use: No   Drug use: No   Sexual activity: Yes    Birth control/protection: Implant  Other Topics Concern   Not on file  Social History Narrative   Not on file   Social Drivers of Health   Financial Resource Strain: Low Risk  (07/10/2024)   Overall Financial Resource Strain (CARDIA)    Difficulty of Paying Living Expenses: Not very hard  Food Insecurity: No Food Insecurity (07/10/2024)   Hunger Vital Sign    Worried About Running Out of Food in the Last Year: Never true    Ran Out of Food in the Last Year: Never true  Transportation Needs: No Transportation Needs (07/10/2024)   PRAPARE - Administrator, Civil Service (Medical): No    Lack of Transportation (Non-Medical): No  Physical Activity: Insufficiently Active (07/10/2024)   Exercise Vital Sign    Days of Exercise per Week: 3 days    Minutes of Exercise per Session: 40 min  Stress: Stress Concern Present (07/10/2024)   [employer] of Occupational Health - Occupational Stress Questionnaire    Feeling of Stress:  Rather much  Social Connections: Socially Isolated (07/10/2024)   Social Connection and Isolation Panel    Frequency of Communication with Friends and Family: Once a week    Frequency of Social Gatherings with Friends and Family: Once a week    Attends Religious Services: Never    Database Administrator or Organizations: No    Attends Banker Meetings: Never    Marital Status: Never married  Intimate Partner Violence: Not At Risk (07/10/2024)   Humiliation, Afraid, Rape, and Kick questionnaire    Fear of Current or Ex-Partner: No    Emotionally Abused: No    Physically Abused: No    Sexually Abused: No     Review of Systems  Psychiatric/Behavioral:  Positive for depression. The patient is nervous/anxious.   All other systems reviewed and are negative.       Objective   BP 129/83   Pulse 89   Ht 5' 8.5 (1.74 m)   Wt 261 lb 3.2 oz (118.5 kg)   LMP 06/26/2024 (Approximate)   SpO2 93%   BMI 39.14 kg/m   Physical Exam Vitals and nursing note reviewed.  Constitutional:      General: She is not in acute distress. Cardiovascular:     Rate and Rhythm: Normal rate and regular rhythm.  Pulmonary:     Effort: Pulmonary effort is normal.     Breath sounds: Normal breath sounds.  Abdominal:     Palpations: Abdomen is soft.     Tenderness: There is no abdominal tenderness.  Neurological:     General: No focal deficit present.     Mental Status: She is alert and oriented to person, place, and time.         Assessment & Plan:   1. PCOS (polycystic ovarian syndrome) (Primary) Spironolactone  prescribed.   2. Mild intermittent reactive airway disease without complication Albuterol  mdi prescribed  3. Class 2 obesity due to excess calories without serious comorbidity with body mass index (BMI) of 39.0 to 39.9 in adult   4. Anxiety state Sertraline  50 mg prescribed  5. Encounter to establish care     Return in about 3 weeks (around 07/31/2024) for follow up.   Tanda Raguel SQUIBB, MD

## 2024-07-31 ENCOUNTER — Encounter: Payer: Self-pay | Admitting: Family Medicine

## 2024-07-31 ENCOUNTER — Ambulatory Visit: Admitting: Family Medicine

## 2024-07-31 VITALS — BP 129/88 | HR 88 | Ht 68.5 in | Wt 264.2 lb

## 2024-07-31 DIAGNOSIS — Z6839 Body mass index (BMI) 39.0-39.9, adult: Secondary | ICD-10-CM

## 2024-07-31 DIAGNOSIS — E66812 Obesity, class 2: Secondary | ICD-10-CM

## 2024-07-31 DIAGNOSIS — G8918 Other acute postprocedural pain: Secondary | ICD-10-CM

## 2024-07-31 DIAGNOSIS — E282 Polycystic ovarian syndrome: Secondary | ICD-10-CM | POA: Diagnosis not present

## 2024-07-31 DIAGNOSIS — F411 Generalized anxiety disorder: Secondary | ICD-10-CM

## 2024-07-31 DIAGNOSIS — L680 Hirsutism: Secondary | ICD-10-CM | POA: Diagnosis not present

## 2024-07-31 DIAGNOSIS — F32A Depression, unspecified: Secondary | ICD-10-CM | POA: Diagnosis not present

## 2024-07-31 DIAGNOSIS — F419 Anxiety disorder, unspecified: Secondary | ICD-10-CM | POA: Diagnosis not present

## 2024-07-31 MED ORDER — SPIRONOLACTONE 50 MG PO TABS: 50.0000 mg | ORAL_TABLET | Freq: Every day | ORAL | 0 refills | Status: AC

## 2024-07-31 MED ORDER — IBUPROFEN 600 MG PO TABS: 600.0000 mg | ORAL_TABLET | Freq: Four times a day (QID) | ORAL | 2 refills | Status: AC

## 2024-07-31 MED ORDER — SERTRALINE HCL 100 MG PO TABS: 100.0000 mg | ORAL_TABLET | Freq: Every day | ORAL | 1 refills | Status: AC

## 2024-08-06 ENCOUNTER — Encounter: Payer: Self-pay | Admitting: Family Medicine

## 2024-08-06 NOTE — Progress Notes (Signed)
 Established Patient Office Visit  Subjective    Patient ID: Crystal Cannon, female    DOB: 1990-11-23  Age: 33 y.o. MRN: [id number]  CC:  Chief Complaint  Patient presents with   Medical Management of Chronic Issues    HPI Sharian Livolsi presents for follow  up of anxiety/depression. Patient also complains of hirsutism from PCOS and some post operative abdominal pain.   Outpatient Encounter Medications as of 07/31/2024  Medication Sig   albuterol  (VENTOLIN  HFA) 108 (90 Base) MCG/ACT inhaler Inhale 2 puffs into the lungs every 4 (four) hours as needed.   sertraline  (ZOLOFT ) 100 MG tablet Take 1 tablet (100 mg total) by mouth daily.   spironolactone  (ALDACTONE ) 25 MG tablet Take 1 tablet (25 mg total) by mouth daily.   spironolactone  (ALDACTONE ) 50 MG tablet Take 1 tablet (50 mg total) by mouth daily.   [DISCONTINUED] sertraline  (ZOLOFT ) 50 MG tablet Take 1 tablet (50 mg total) by mouth daily.   ibuprofen  (ADVIL ) 600 MG tablet Take 1 tablet (600 mg total) by mouth every 6 (six) hours.   [DISCONTINUED] ibuprofen  (ADVIL ) 600 MG tablet Take 1 tablet (600 mg total) by mouth every 6 (six) hours. (Patient not taking: Reported on 07/10/2024)   No facility-administered encounter medications on file as of 07/31/2024.    History reviewed. No pertinent past medical history.  Past Surgical History:  Procedure Laterality Date   CESAREAN SECTION MULTI-GESTATIONAL N/A 06/15/2021   Procedure: CESAREAN SECTION MULTI-GESTATIONAL;  Surgeon: Alger Gong, MD;  Location: [facility];  Service: Obstetrics;  Laterality: N/A;    History reviewed. No pertinent family history.  Social History   Socioeconomic History   Marital status: Single    Spouse name: Not on file   Number of children: Not on file   Years of education: Not on file   Highest education level: Not on file  Occupational History   Not on file  Tobacco Use   Smoking status: Former    Current packs/day: 0.00    Average  packs/day: 0.3 packs/day    Types: Cigarettes    Quit date: 2015    Years since quitting: 10.9   Smokeless tobacco: Never  Vaping Use   Vaping status: Never Used  Substance and Sexual Activity   Alcohol use: No   Drug use: No   Sexual activity: Yes    Birth control/protection: Implant  Other Topics Concern   Not on file  Social History Narrative   Not on file   Social Drivers of Health   Tobacco Use: Medium Risk (07/31/2024)   Patient History    Smoking Tobacco Use: Former    Smokeless Tobacco Use: Never    Passive Exposure: Not on Actuary Strain: Low Risk (07/10/2024)   Overall Financial Resource Strain (CARDIA)    Difficulty of Paying Living Expenses: Not very hard  Food Insecurity: No Food Insecurity (07/10/2024)   Epic    Worried About Programme Researcher, Broadcasting/film/video in the Last Year: Never true    Ran Out of Food in the Last Year: Never true  Transportation Needs: No Transportation Needs (07/10/2024)   Epic    Lack of Transportation (Medical): No    Lack of Transportation (Non-Medical): No  Physical Activity: Insufficiently Active (07/10/2024)   Exercise Vital Sign    Days of Exercise per Week: 3 days    Minutes of Exercise per Session: 40 min  Stress: Stress Concern Present (07/10/2024)   [employer] of Occupational Health -  Occupational Stress Questionnaire    Feeling of Stress: Rather much  Social Connections: Socially Isolated (07/10/2024)   Social Connection and Isolation Panel    Frequency of Communication with Friends and Family: Once a week    Frequency of Social Gatherings with Friends and Family: Once a week    Attends Religious Services: Never    Database Administrator or Organizations: No    Attends Banker Meetings: Never    Marital Status: Never married  Intimate Partner Violence: Not At Risk (07/10/2024)   Epic    Fear of Current or Ex-Partner: No    Emotionally Abused: No    Physically Abused: No    Sexually Abused:  No  Depression (PHQ2-9): High Risk (07/10/2024)   Depression (PHQ2-9)    PHQ-2 Score: 24  Alcohol Screen: Not on file  Housing: Low Risk (07/10/2024)   Epic    Unable to Pay for Housing in the Last Year: No    Number of Times Moved in the Last Year: 0    Homeless in the Last Year: No  Utilities: Not At Risk (07/10/2024)   Epic    Threatened with loss of utilities: No  Health Literacy: Adequate Health Literacy (07/10/2024)   B1300 Health Literacy    Frequency of need for help with medical instructions: Never    Review of Systems  All other systems reviewed and are negative.       Objective    BP 129/88   Pulse 88   Ht 5' 8.5 (1.74 m)   Wt 264 lb 3.2 oz (119.8 kg)   LMP 06/26/2024 (Approximate)   SpO2 95%   BMI 39.59 kg/m   Physical Exam Vitals and nursing note reviewed.  Constitutional:      General: She is not in acute distress. Cardiovascular:     Rate and Rhythm: Normal rate and regular rhythm.  Pulmonary:     Effort: Pulmonary effort is normal.     Breath sounds: Normal breath sounds.  Abdominal:     Palpations: Abdomen is soft.     Tenderness: There is no abdominal tenderness.  Neurological:     General: No focal deficit present.     Mental Status: She is alert and oriented to person, place, and time.  Psychiatric:        Mood and Affect: Mood normal.        Behavior: Behavior normal.         Assessment & Plan:  1. Anxiety and depression (Primary) Appears stable. Zoloft  refilled  2. Postoperative pain Follow up with consultant for further eval/mgt - ibuprofen  (ADVIL ) 600 MG tablet; Take 1 tablet (600 mg total) by mouth every 6 (six) hours.  Dispense: 60 tablet; Refill: 2  3. Hirsutism Spironolactone   prescribed  4. PCOS (polycystic ovarian syndrome) Follow up with consultant  5. Class 2 severe obesity due to excess calories with serious comorbidity and body mass index (BMI) of 39.0 to 39.9 in adult     Return for follow up.    Tanda Raguel SQUIBB, MD

## 2024-09-30 ENCOUNTER — Ambulatory Visit: Admitting: Family Medicine
